# Patient Record
Sex: Female | Born: 1960 | Race: White | Hispanic: Yes | Marital: Married | Smoking: Never smoker
Health system: Southern US, Community
[De-identification: ages and names within clinical notes are randomized; demographics above are authoritative.]

## PROBLEM LIST (undated history)

## (undated) DIAGNOSIS — K2270 Barrett's esophagus without dysplasia: Secondary | ICD-10-CM

## (undated) DIAGNOSIS — F419 Anxiety disorder, unspecified: Secondary | ICD-10-CM

## (undated) HISTORY — PX: TRIGGER FINGER RELEASE: SHX641

## (undated) HISTORY — PX: ESOPHAGOGASTRODUODENOSCOPY: SHX1529

## (undated) HISTORY — DX: Anxiety disorder, unspecified: F41.9

## (undated) HISTORY — PX: OTHER SURGICAL HISTORY: SHX169

## (undated) HISTORY — PX: TUBAL LIGATION: SHX77

## (undated) HISTORY — PX: AUGMENTATION MAMMAPLASTY: SUR837

## (undated) HISTORY — PX: COLONOSCOPY: SHX174

## (undated) HISTORY — DX: Barrett's esophagus without dysplasia: K22.70

---

## 2006-08-29 ENCOUNTER — Ambulatory Visit: Payer: Self-pay | Admitting: Gastroenterology

## 2006-08-29 LAB — CONVERTED CEMR LAB
Albumin: 3.7 g/dL (ref 3.5–5.2)
Alkaline Phosphatase: 56 units/L (ref 39–117)
BUN: 7 mg/dL (ref 6–23)
Basophils Absolute: 0 10*3/uL (ref 0.0–0.1)
Eosinophils Absolute: 0.3 10*3/uL (ref 0.0–0.6)
Folate: 10.2 ng/mL
GFR calc Af Amer: 140 mL/min
Iron: 65 ug/dL (ref 42–145)
Lymphocytes Relative: 36.9 % (ref 12.0–46.0)
MCHC: 34 g/dL (ref 30.0–36.0)
MCV: 92.5 fL (ref 78.0–100.0)
Monocytes Relative: 5.8 % (ref 3.0–11.0)
Neutro Abs: 2.5 10*3/uL (ref 1.4–7.7)
Platelets: 264 10*3/uL (ref 150–400)
Potassium: 4.4 meq/L (ref 3.5–5.1)
RBC: 3.69 M/uL — ABNORMAL LOW (ref 3.87–5.11)
Saturation Ratios: 17.6 % — ABNORMAL LOW (ref 20.0–50.0)
Sodium: 139 meq/L (ref 135–145)
Total Bilirubin: 0.5 mg/dL (ref 0.3–1.2)
Total Protein: 6.8 g/dL (ref 6.0–8.3)
Transferrin: 263.7 mg/dL (ref >=212.0)

## 2006-09-07 ENCOUNTER — Ambulatory Visit: Payer: Self-pay | Admitting: Gastroenterology

## 2006-09-13 ENCOUNTER — Ambulatory Visit: Payer: Self-pay | Admitting: Gastroenterology

## 2006-09-13 ENCOUNTER — Encounter (INDEPENDENT_AMBULATORY_CARE_PROVIDER_SITE_OTHER): Payer: Self-pay | Admitting: Specialist

## 2006-09-20 ENCOUNTER — Ambulatory Visit: Payer: Self-pay | Admitting: Gastroenterology

## 2006-10-03 ENCOUNTER — Ambulatory Visit: Payer: Self-pay | Admitting: Gastroenterology

## 2006-10-05 ENCOUNTER — Ambulatory Visit (HOSPITAL_COMMUNITY): Admission: RE | Admit: 2006-10-05 | Discharge: 2006-10-05 | Payer: Self-pay | Admitting: Gastroenterology

## 2006-12-01 ENCOUNTER — Ambulatory Visit: Payer: Self-pay | Admitting: Gastroenterology

## 2006-12-07 ENCOUNTER — Other Ambulatory Visit: Admission: RE | Admit: 2006-12-07 | Discharge: 2006-12-07 | Payer: Self-pay | Admitting: Gynecology

## 2007-04-11 ENCOUNTER — Other Ambulatory Visit: Admission: RE | Admit: 2007-04-11 | Discharge: 2007-04-11 | Payer: Self-pay | Admitting: Gynecology

## 2007-07-25 HISTORY — PX: ENDOMETRIAL ABLATION: SHX621

## 2007-08-14 DIAGNOSIS — Z87442 Personal history of urinary calculi: Secondary | ICD-10-CM

## 2007-08-14 DIAGNOSIS — A048 Other specified bacterial intestinal infections: Secondary | ICD-10-CM | POA: Insufficient documentation

## 2007-08-14 DIAGNOSIS — K219 Gastro-esophageal reflux disease without esophagitis: Secondary | ICD-10-CM

## 2007-08-14 DIAGNOSIS — D539 Nutritional anemia, unspecified: Secondary | ICD-10-CM | POA: Insufficient documentation

## 2007-08-14 DIAGNOSIS — Z87898 Personal history of other specified conditions: Secondary | ICD-10-CM | POA: Insufficient documentation

## 2007-08-14 DIAGNOSIS — K828 Other specified diseases of gallbladder: Secondary | ICD-10-CM | POA: Insufficient documentation

## 2007-08-14 DIAGNOSIS — K2270 Barrett's esophagus without dysplasia: Secondary | ICD-10-CM

## 2008-03-20 ENCOUNTER — Encounter: Admission: RE | Admit: 2008-03-20 | Discharge: 2008-03-20 | Payer: Self-pay | Admitting: Gynecology

## 2008-03-31 ENCOUNTER — Encounter: Admission: RE | Admit: 2008-03-31 | Discharge: 2008-03-31 | Payer: Self-pay | Admitting: Gynecology

## 2008-04-02 ENCOUNTER — Other Ambulatory Visit: Admission: RE | Admit: 2008-04-02 | Discharge: 2008-04-02 | Payer: Self-pay | Admitting: Gynecology

## 2008-04-02 ENCOUNTER — Telehealth: Payer: Self-pay | Admitting: Gastroenterology

## 2008-04-02 ENCOUNTER — Encounter: Payer: Self-pay | Admitting: Gynecology

## 2008-04-02 ENCOUNTER — Ambulatory Visit: Payer: Self-pay | Admitting: Gynecology

## 2008-04-17 ENCOUNTER — Ambulatory Visit: Payer: Self-pay | Admitting: Gastroenterology

## 2008-05-07 ENCOUNTER — Ambulatory Visit (HOSPITAL_COMMUNITY): Admission: RE | Admit: 2008-05-07 | Discharge: 2008-05-07 | Payer: Self-pay | Admitting: Gastroenterology

## 2008-10-24 ENCOUNTER — Encounter: Payer: Self-pay | Admitting: Gynecology

## 2008-10-24 ENCOUNTER — Other Ambulatory Visit: Admission: RE | Admit: 2008-10-24 | Discharge: 2008-10-24 | Payer: Self-pay | Admitting: Gynecology

## 2008-10-24 ENCOUNTER — Ambulatory Visit: Payer: Self-pay | Admitting: Gynecology

## 2008-11-13 ENCOUNTER — Ambulatory Visit: Payer: Self-pay | Admitting: Gynecology

## 2009-02-04 ENCOUNTER — Telehealth: Payer: Self-pay | Admitting: Gastroenterology

## 2009-02-04 ENCOUNTER — Ambulatory Visit: Payer: Self-pay | Admitting: Gynecology

## 2009-03-05 ENCOUNTER — Ambulatory Visit: Payer: Self-pay | Admitting: Gastroenterology

## 2009-03-23 ENCOUNTER — Ambulatory Visit: Payer: Self-pay | Admitting: Gastroenterology

## 2009-03-30 ENCOUNTER — Encounter: Admission: RE | Admit: 2009-03-30 | Discharge: 2009-03-30 | Payer: Self-pay | Admitting: Gynecology

## 2009-03-31 ENCOUNTER — Encounter: Payer: Self-pay | Admitting: Gastroenterology

## 2009-04-06 ENCOUNTER — Ambulatory Visit: Payer: Self-pay | Admitting: Gynecology

## 2009-04-06 ENCOUNTER — Telehealth: Payer: Self-pay | Admitting: Gastroenterology

## 2009-04-06 ENCOUNTER — Other Ambulatory Visit: Admission: RE | Admit: 2009-04-06 | Discharge: 2009-04-06 | Payer: Self-pay | Admitting: Gynecology

## 2009-04-13 ENCOUNTER — Ambulatory Visit: Payer: Self-pay | Admitting: Gynecology

## 2009-06-11 ENCOUNTER — Ambulatory Visit: Payer: Self-pay | Admitting: Gynecology

## 2010-04-06 ENCOUNTER — Encounter: Admission: RE | Admit: 2010-04-06 | Discharge: 2010-04-06 | Payer: Self-pay | Admitting: Gynecology

## 2010-04-12 ENCOUNTER — Ambulatory Visit: Payer: Self-pay | Admitting: Gynecology

## 2010-04-12 ENCOUNTER — Other Ambulatory Visit
Admission: RE | Admit: 2010-04-12 | Discharge: 2010-04-12 | Payer: Self-pay | Source: Home / Self Care | Admitting: Gynecology

## 2010-05-13 ENCOUNTER — Ambulatory Visit
Admission: RE | Admit: 2010-05-13 | Discharge: 2010-05-13 | Payer: Self-pay | Source: Home / Self Care | Attending: Gynecology | Admitting: Gynecology

## 2010-05-19 ENCOUNTER — Ambulatory Visit
Admission: RE | Admit: 2010-05-19 | Discharge: 2010-05-19 | Payer: Self-pay | Source: Home / Self Care | Attending: Gynecology | Admitting: Gynecology

## 2010-09-02 IMAGING — MG MM DIAGNOSTIC LTD LEFT
3 series · 3 of 3 positions shown · non-contrast
Comparison: No prior exams.

CLINICAL DATA: Patient presents for additional views of the left
breast as a follow up to recent screening mammogram from 03/20/2008
demonstrating a 4 mm nodular density in the inner left breast.

DIGITAL DIAGNOSTIC  LEFT  MAMMOGRAM   AND LEFT BREAST ULTRASOUND:

[L CCID]
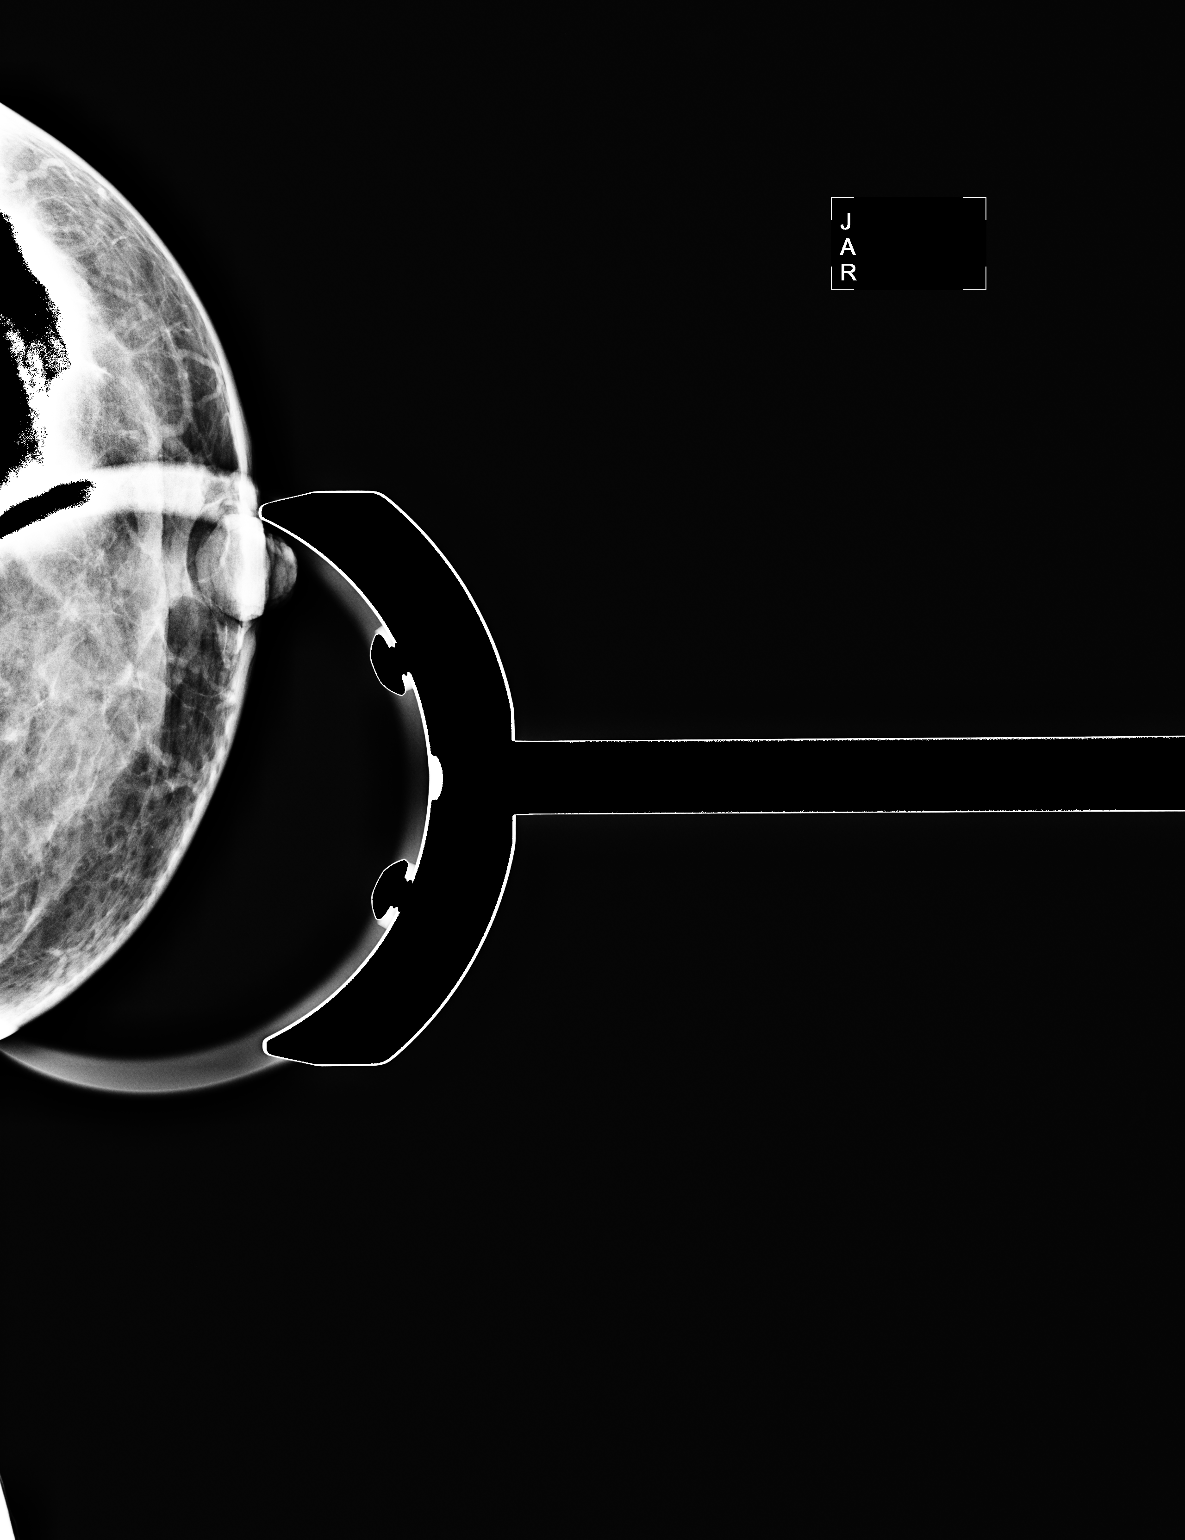

[L ML]
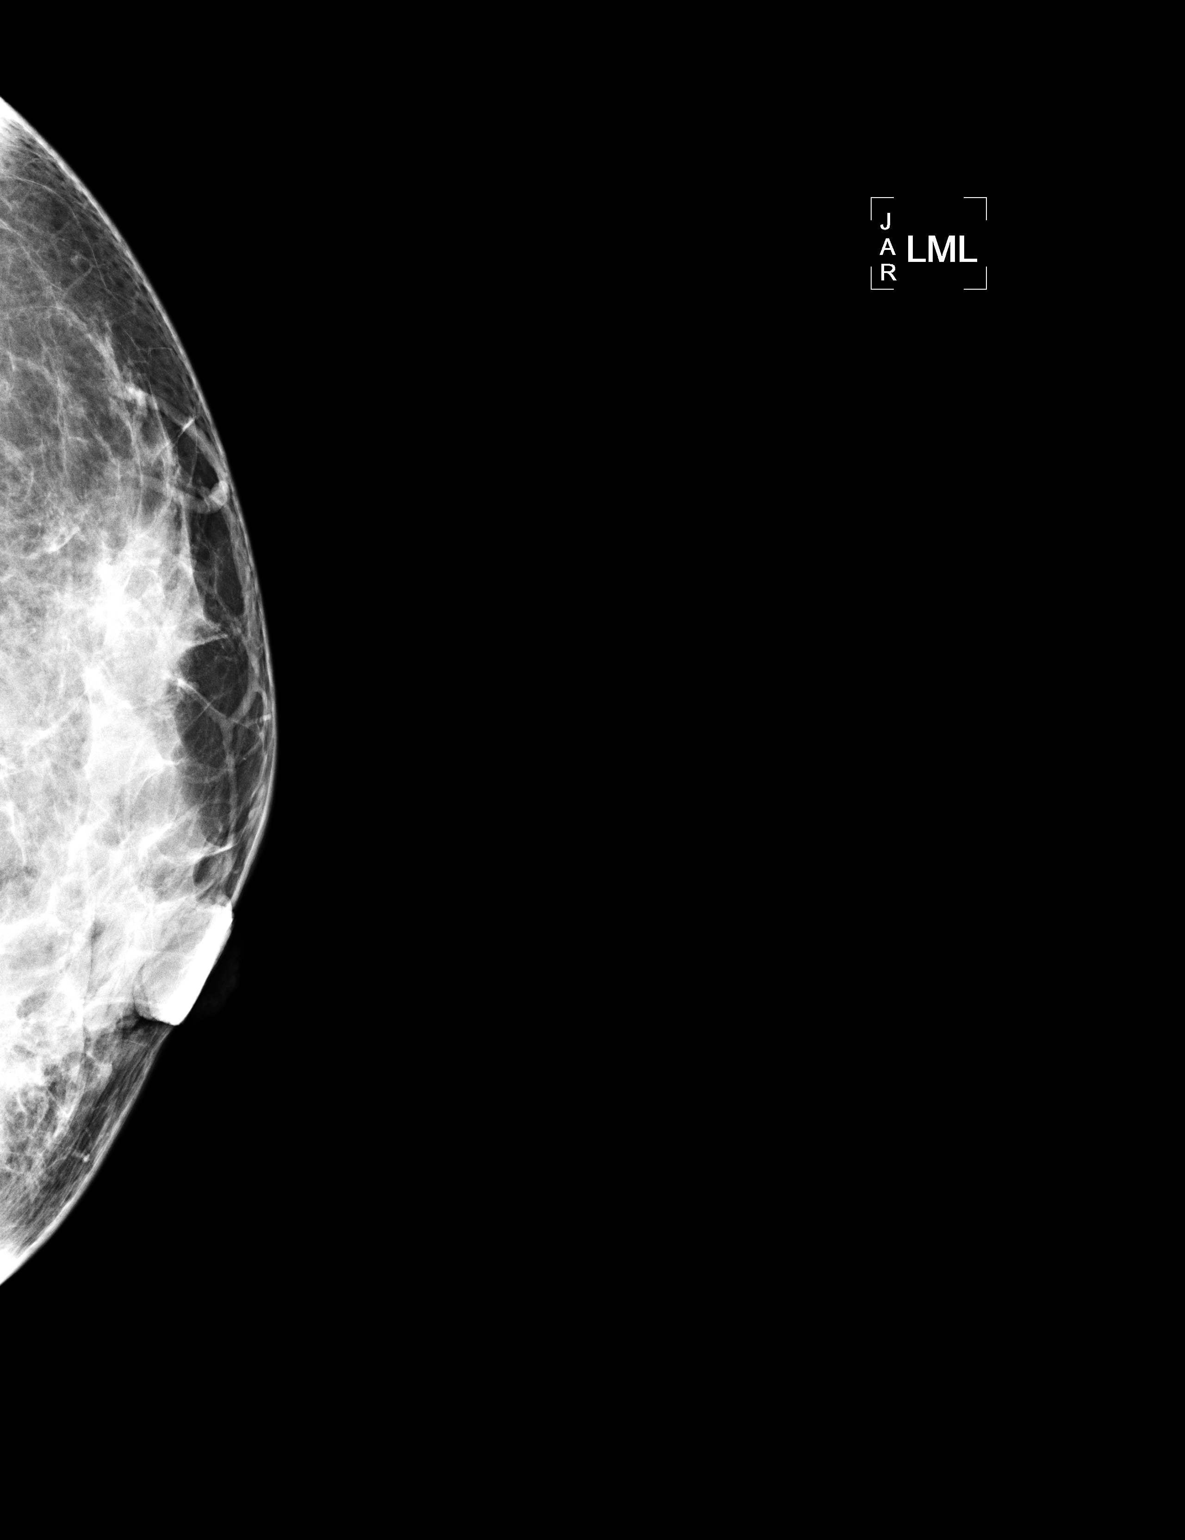

[L MLOID]
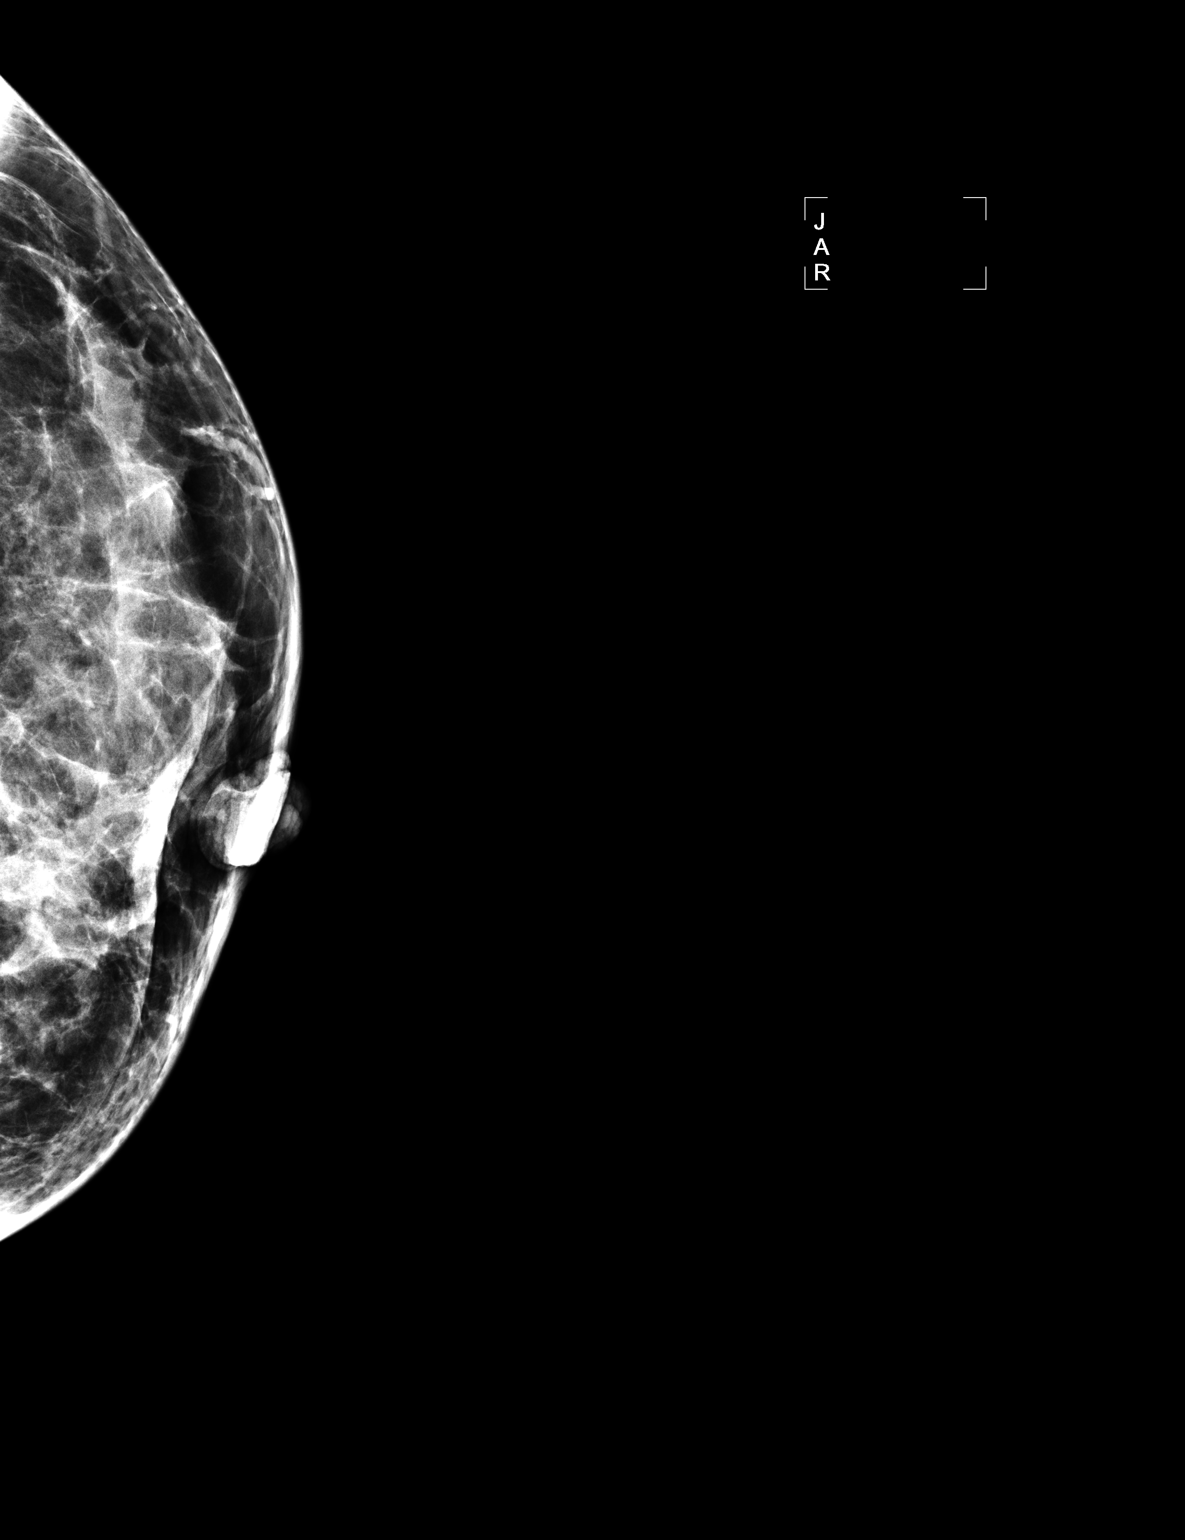

[3 of 3 positions shown; findings below may reference images not displayed]

FINDINGS: A true lateral image as well as spot compression CC image
and MLO view were obtained.  The additional images fail to
demonstrated a focal abnormality in the inner left breast.  Due to
its appearance on the original screening CC image, an ultrasound
will be performed.

Ultrasound is performed, showing no focal abnormality in the inner
left breast.
IMPRESSION: No focal abnormality in the inner left breast.

Recommendations:  Recommend continued annual screening mammographic
followup.

BI-RADS CATEGORY 1:  Negative.

## 2010-09-14 NOTE — Assessment & Plan Note (Signed)
[facility]                         GASTROENTEROLOGY OFFICE NOTE   Gail Miller, Gail Miller                         MRN:          [id number]  DATE:12/01/2006                            DOB:          01/06/1961    [name] is completely asymptomatic on an antireflux regimen on daily  Nexium.  Her endoscopy showed a short segment of Barrett's mucosa,  consistent with chronic acid reflux.  Gallbladder workup, including CCK-  HIDA scan, was normal.  She does have a 7.5 gallbladder polyp.  We will  repeat her ultrasound exam in six months.   I have explained Barrett's mucosa to [name].  I think she is at very low  risk for esophageal cancer.  I would suggest that she have endoscopy at  three-year intervals, that she continue chronic PPI therapy.   On her lab data, she had some mild iron deficiency and I have given her  a couple of months of iron replacement therapy to take.   I have scheduled her to see Dr. Reynaldo Minium for OB-GYN evaluation.  She does have mild menometrorrhagia and needs to establish with a  gynecologist since moving to [name] from [name].  I will see her on a  yearly basis or p.r.n., as needed.     Vania Rea. Jarold Motto, MD, Caleen Essex, FAGA  Electronically Signed    DRP/MedQ  DD: 12/01/2006  DT: 12/01/2006  Job #: [id number]   cc:   Dollene Cleveland, M.D.  Dr. Paulene Floor  Gaetano Hawthorne. Lily Peer, M.D.

## 2010-09-14 NOTE — Assessment & Plan Note (Signed)
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 Paulene Floor for evaluation of epigastric abdominal pain.   HISTORY OF PRESENT ILLNESS:  In March, Gail Miller developed some nausea  and epigastric pain which she felt was related to a viral syndrome that  affected her syndrome.  Her children got well but she continued with  epigastric discomfort and nausea and saw Dr. Daphine Deutscher and had a positive  Helicobacter pylori serum antibody test.  She was treated with 14 days  of Prev-Pac and really had no significant improvement.  She was found to  be iron deficient and was placed on iron and also on Nexium 40 mg day  and has had fairly good improvement but continued with periodic  subxiphoid-epigastric pain radiating to her back with mild nausea.  She  has had no true reflux symptoms or dysphagia and denies any  hepatobiliary complaints such as clay colored stools, dark urine,  icterus, fever or chills.  Her appetite was initially poor and she lost  10 pounds of weight but she has gained this back.  She has had no change  in bowel habits, melena, hematochezia or any food intolerances.  She  denies fever, chills, skin rashes, joint pains, mouth sores or any other  systemic complaints.  It is of note that the patient had colonoscopy  some 15 years ago in [name], [place], that was unremarkable.  She was  having acute diarrhea syndrome at that time.  She had not had previous  endoscopy or ultrasound examinations of her abdomen.  She saw Dr. Daphine Deutscher  and additionally apparently had some normal lab data, although I do not  have that available for review.  She denies any specific food  intolerances.  She does not use alcohol,  cigarettes or NSAIDs.  She does  have migraine headaches but uses p.r.n. Tylenol.   PAST MEDICAL HISTORY:  1. Problems with kidney stones.  2. She has had a previous tubal ligation and breast surgery.  She has      not had regular menstrual periods in over a year.   MEDICATIONS:  1. Nexium 40 mg a day.  2. Zoloft 100 mg a day.  3. Butal-APAP p.r.n. for headaches.   FAMILY HISTORY:  Remarkable for unexplained liver disease in her  maternal grandmother and cousin.  There are no known other  gastrointestinal problems.   SOCIAL HISTORY:  She is married and lives with her husband and children.  She has some college education.  She smokes about five cigarettes a day  and denies alcohol abuse.   REVIEW OF SYSTEMS:  Remarkable for frequent nonproductive cough, some  chronic low back pain and chronic fatigue.  She denies any specific  cardiovascular, pulmonary, genitourinary, neurologic or orthopedic  problems.  She does have a past history of anxiety  and uses Zoloft.   PHYSICAL EXAMINATION:  GENERAL APPEARANCE:  An attractive female  appearing younger than her stated age.  VITAL SIGNS:  She is 5 feet 6 inches tall and weighs 125 pounds.  Blood  pressure 110/72, pulse 76 and regular.  I could not appreciate stigmata of chronic liver disease or thyromegaly.  CHEST:  Entirely clear.  CARDIOVASCULAR:  She was in a regular rhythm without murmurs, rubs, or  gallops.  ABDOMEN:  Flat and nontender without organomegaly, masses, or  significant tenderness.  Bowel sounds were normal.  EXTREMITIES:  Unremarkable.  RECTAL:  Deferred.  NEUROLOGIC:  Mental status was clear.   ASSESSMENT:  1. Epigastric abdominal pain radiating to her back consistent with      acid reflux disease or perhaps nonsteroidal anti-inflammatory drug-      induced peptic ulcer disease.  2. History of positive Helicobacter pylori antibody with no response      to Prev-Pac therapy.  3. Rule out cholelithiasis.  4.  History of chronic anemia of unexplained etiology.  5. Vague history of familial liver disease.   RECOMMENDATIONS:  1. Check CBC, liver profile, B12, folate and iron levels.  2. Outpatient ultrasound and endoscopic exam and will check for      Helicobacter pylori with biopsies.  3. Continue Nexium 40 mg 30 minutes before the first meal of the day      and twice a day if needed.  4. Consider colonoscopy depending on the clinical course and work-up.     Vania Rea. Jarold Motto, MD, Caleen Essex, FAGA  Electronically Signed    DRP/MedQ  DD: 08/29/2006  DT: 08/29/2006  Job #: [id number]

## 2010-09-14 NOTE — Assessment & Plan Note (Signed)
[facility]                         GASTROENTEROLOGY OFFICE NOTE   SHELLI, PORTILLA                         MRN:          [id number]  DATE:10/03/2006                            DOB:          1961/02/13    Gail Miller returns today and still is having some subxiphoid discomfort and  nausea.  She has not taken her proton pump inhibitor therapy as  recommended, and is only using this on a p.r.n. basis.  She continues to  complain of a fullness in her subxiphoid area with some belching and  burping.  Her endoscopic exam was unremarkable, and esophageal biopsies  were apparently unremarkable also.  She had ultrasound that was repeated  because of an abnormal gallbladder, and it appears that she has a  gallbladder polyp.  Her liver function tests and other blood work is all  entirely normal.   PHYSICAL EXAMINATION:  VITAL SIGNS:  She weighs 127 pounds, and blood  pressure is 108/54.  Pulse was 78 and regular.  ABDOMEN:  Unremarkable without hepatosplenomegaly, masses or significant  tenderness.  Bowel sounds were normal.   ASSESSMENT:  1. Probable GERD with incomplete treatment.  2. Rule out dysfunctional gallbladder.  3. 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   RECOMMENDATIONS:  1. Check CCK-HIDA scan.  2. Reflux regime daily.  3. Nexium 40 mg 30 minutes before breakfast.  4. Referral to Dr. Dorinda Hill in dermatology for consultation.  5. GI follow up in one months' time.     Gail Miller. Jarold Motto, MD, Caleen Essex, FAGA  Electronically Signed    DRP/MedQ  DD: 10/03/2006  DT: 10/03/2006  Job #: [id number]   cc:   Paulene Floor, M.D.  Dollene Cleveland, M.D.

## 2011-02-24 ENCOUNTER — Other Ambulatory Visit: Payer: Self-pay | Admitting: *Deleted

## 2011-02-24 MED ORDER — ALPRAZOLAM 0.25 MG PO TABS: 0.2500 mg | ORAL_TABLET | Freq: Every evening | ORAL | Status: DC | PRN

## 2011-02-24 NOTE — Telephone Encounter (Signed)
rx called

## 2011-03-16 ENCOUNTER — Encounter: Payer: Self-pay | Admitting: Anesthesiology

## 2011-03-22 ENCOUNTER — Other Ambulatory Visit (HOSPITAL_COMMUNITY)
Admission: RE | Admit: 2011-03-22 | Discharge: 2011-03-22 | Disposition: A | Discharge status: Home / Self Care | Payer: BC Managed Care – PPO | Source: Ambulatory Visit | Attending: Gynecology | Admitting: Gynecology

## 2011-03-22 ENCOUNTER — Encounter: Payer: Self-pay | Admitting: Gynecology

## 2011-03-22 ENCOUNTER — Ambulatory Visit (INDEPENDENT_AMBULATORY_CARE_PROVIDER_SITE_OTHER): Payer: BC Managed Care – PPO | Admitting: Gynecology

## 2011-03-22 VITALS — BP 108/70 | Ht 65.5 in | Wt 123.0 lb

## 2011-03-22 DIAGNOSIS — Z01419 Encounter for gynecological examination (general) (routine) without abnormal findings: Secondary | ICD-10-CM | POA: Insufficient documentation

## 2011-03-22 DIAGNOSIS — F419 Anxiety disorder, unspecified: Secondary | ICD-10-CM

## 2011-03-22 DIAGNOSIS — N951 Menopausal and female climacteric states: Secondary | ICD-10-CM

## 2011-03-22 DIAGNOSIS — R82998 Other abnormal findings in urine: Secondary | ICD-10-CM

## 2011-03-22 DIAGNOSIS — Z780 Asymptomatic menopausal state: Secondary | ICD-10-CM | POA: Insufficient documentation

## 2011-03-22 DIAGNOSIS — Z13220 Encounter for screening for lipoid disorders: Secondary | ICD-10-CM

## 2011-03-22 DIAGNOSIS — F411 Generalized anxiety disorder: Secondary | ICD-10-CM

## 2011-03-22 DIAGNOSIS — E559 Vitamin D deficiency, unspecified: Secondary | ICD-10-CM | POA: Insufficient documentation

## 2011-03-22 MED ORDER — ALPRAZOLAM 0.25 MG PO TABS: 0.2500 mg | ORAL_TABLET | Freq: Every evening | ORAL | Status: AC | PRN

## 2011-03-22 NOTE — Progress Notes (Signed)
Gail Miller 1960-10-20 [id number]   History:    50 y.o.  for annual exam who states that she has noticed no difference in her minimal vasomotor symptoms now as she did when she was not on hormone replacement therapy. She has history vitamin D deficiency in the past and is currently on vitamin D 50,000 units q. monthly. Mammogram December 2011 normal patient with prior silicon implants does her monthly self breast examination.  Past medical history,surgical history, family history and social history were all reviewed and documented in the EPIC chart.  Gynecologic History Patient's last menstrual period was 03/04/2011. Contraception: tubal ligation Last Pap: 2011. Results were:normal} Last mammogram: 2011. Results were:normal}  Obstetric History OB History    Grav Para Term Preterm Abortions TAB SAB Ect Mult Living   4 3 3  1  1   3      # Outc Date GA Lbr Len/2nd Wgt Sex Del Anes PTL Lv   1 TRM     M SVD  No Yes   2 TRM     M SVD  No Yes   3 TRM     M SVD  No Yes   4 SAB                ROS:  Was performed and pertinent positives and negatives are included in the history.  Exam: chaperone present  BP 108/70  Ht 5' 5.5" (1.664 m)  Wt 123 lb (55.792 kg)  BMI 20.16 kg/m2  LMP 03/04/2011  Body mass index is 20.16 kg/(m^2).  General appearance : Well developed well nourished female. No acute distress HEENT: Neck supple, trachea midline, no carotid bruits, no thyroidmegaly Lungs: Clear to auscultation, no rhonchi or wheezes, or rib retractions  Heart: Regular rate and rhythm, no murmurs or gallops Breast:Examined in sitting and supine position were symmetrical in appearance, no palpable masses or tenderness,  no skin retraction, no nipple inversion, no nipple discharge, no skin discoloration, no axillary or supraclavicular lymphadenopathy Abdomen: no palpable masses or tenderness, no rebound or guarding Extremities: no edema or skin discoloration or  tenderness  Pelvic:  Bartholin, Urethra, Skene Glands: Within normal limits             Vagina: No gross lesions or discharge  Cervix: No gross lesions or discharge  Uterus  anteverted, normal size, shape and consistency, non-tender and mobile  Adnexa  Without masses or tenderness  Anus and perineum  normal   Rectovaginal  normal sphincter tone without palpated masses or tenderness             Hemoccult not done     Assessment/Plan:  50 y.o. female for annual exam who states that she has minimal vasomotor symptoms and has noticed no change in comparison to when she was off of hormone replacement therapy. She will discontinue the elestrin and the Provera and we will monitor her symptoms over the course of the next 3-6 months. She was encouraged to continue monthly self breast examination. Requisition to schedule mammogram was provided. She had a bone density study in January of 2012 with her lowest T score at the right femoral neck with a value of -2.0. Her Frax analysis demonstrated her major osteoporotic fracture risk over a 10 year. Is 4.2% and her 10 year probability of hip fracture and 0.7% both sub-threshold for additional treatment at the present time. We'll repeat her bone density study in 2 years. She was instructed to continue with her calcium and vitamin  D and to engage in weightbearing exercises at least 45 minutes 3 or 4 times a week. Patient was scheduled her mammogram for next month. Next year she will need a colonoscopy. She'll stop by the lab we'll check her CBC cholesterol urinalysis along with vitamin D. Pap smear done today. Prescription refill for Xanax 0.25 mg she takes in appearance base is was provided as well.   Ok Edwards MD, 9:37 AM 03/22/2011

## 2011-03-23 ENCOUNTER — Telehealth: Payer: Self-pay | Admitting: *Deleted

## 2011-03-23 LAB — VITAMIN D 25 HYDROXY (VIT D DEFICIENCY, FRACTURES): Vit D, 25-Hydroxy: 27 ng/mL — ABNORMAL LOW (ref 30–89)

## 2011-03-23 MED ORDER — SULFAMETHOXAZOLE-TMP DS 800-160 MG PO TABS: 1.0000 | ORAL_TABLET | Freq: Two times a day (BID) | ORAL | Status: AC

## 2011-03-23 NOTE — Telephone Encounter (Signed)
PT HAS BEEN INFORMED OF BACTERIA IN URINE FOR UTI AND WILL TAKE MEDICATION AS DIRECTED.

## 2011-03-28 ENCOUNTER — Other Ambulatory Visit: Payer: Self-pay | Admitting: *Deleted

## 2011-03-28 DIAGNOSIS — N390 Urinary tract infection, site not specified: Secondary | ICD-10-CM

## 2011-03-29 ENCOUNTER — Telehealth: Payer: Self-pay | Admitting: *Deleted

## 2011-03-29 MED ORDER — FLUCONAZOLE 150 MG PO TABS: 150.0000 mg | ORAL_TABLET | Freq: Once | ORAL | Status: AC

## 2011-03-29 NOTE — Telephone Encounter (Signed)
Pt informed that diflucan sent to pharmacy.

## 2011-03-29 NOTE — Telephone Encounter (Signed)
Pt called complaining of yeast infection from taking bactrim ds on 03/23/11 given by TF for UTI. Pt only has itching, no white discharge or other complains. She would like something to relieve itching. Please advise.

## 2011-04-04 ENCOUNTER — Ambulatory Visit (INDEPENDENT_AMBULATORY_CARE_PROVIDER_SITE_OTHER): Payer: BC Managed Care – PPO | Admitting: Gynecology

## 2011-04-04 DIAGNOSIS — R8271 Bacteriuria: Secondary | ICD-10-CM

## 2011-04-04 DIAGNOSIS — N390 Urinary tract infection, site not specified: Secondary | ICD-10-CM

## 2011-04-07 ENCOUNTER — Ambulatory Visit (INDEPENDENT_AMBULATORY_CARE_PROVIDER_SITE_OTHER): Payer: BC Managed Care – PPO | Admitting: Gynecology

## 2011-04-07 DIAGNOSIS — R8271 Bacteriuria: Secondary | ICD-10-CM

## 2011-04-07 DIAGNOSIS — N390 Urinary tract infection, site not specified: Secondary | ICD-10-CM

## 2011-04-07 DIAGNOSIS — R82998 Other abnormal findings in urine: Secondary | ICD-10-CM

## 2011-04-08 MED ORDER — NITROFURANTOIN MONOHYD MACRO 100 MG PO CAPS: 100.0000 mg | ORAL_CAPSULE | Freq: Two times a day (BID) | ORAL | Status: AC

## 2011-04-11 ENCOUNTER — Telehealth: Payer: Self-pay | Admitting: *Deleted

## 2011-04-11 ENCOUNTER — Other Ambulatory Visit: Payer: Self-pay | Admitting: Gynecology

## 2011-04-11 DIAGNOSIS — Z1231 Encounter for screening mammogram for malignant neoplasm of breast: Secondary | ICD-10-CM

## 2011-04-11 NOTE — Telephone Encounter (Signed)
Pharmacy faxed over form  request xanax 0.5, pt needs new rx. JF wrote new rx on 03/22/11 for xanax 0.25 mg #30 2 refills. Called rx in to pharmacy

## 2011-04-18 ENCOUNTER — Telehealth: Payer: Self-pay | Admitting: *Deleted

## 2011-04-18 ENCOUNTER — Ambulatory Visit (INDEPENDENT_AMBULATORY_CARE_PROVIDER_SITE_OTHER): Payer: BC Managed Care – PPO | Admitting: Gynecology

## 2011-04-18 DIAGNOSIS — N390 Urinary tract infection, site not specified: Secondary | ICD-10-CM

## 2011-04-18 DIAGNOSIS — R82998 Other abnormal findings in urine: Secondary | ICD-10-CM

## 2011-04-18 NOTE — Telephone Encounter (Signed)
Patient called the office today but wanted to drop off her urine today. Early in the month she was treated for urinary tract infection with Macrobid. We'll have her drop a sample off for test of cure.

## 2011-04-18 NOTE — Telephone Encounter (Signed)
Pt informed with the below note and will drop off u/a

## 2011-04-18 NOTE — Telephone Encounter (Signed)
Pt has had recurrent UTI's various times, she was last treated on 04/07/11 and given macrobid 100 mg x 7days to take. Pt wants to know if she could leave a U/A to check? Pt is leaving out of town for the holidays and her insurance will change the beginning of the year. Please advise

## 2011-05-18 ENCOUNTER — Ambulatory Visit
Admission: RE | Admit: 2011-05-18 | Discharge: 2011-05-18 | Disposition: A | Discharge status: Home / Self Care | Payer: BC Managed Care – PPO | Source: Ambulatory Visit | Attending: Gynecology | Admitting: Gynecology

## 2011-05-18 DIAGNOSIS — Z1231 Encounter for screening mammogram for malignant neoplasm of breast: Secondary | ICD-10-CM

## 2011-07-18 ENCOUNTER — Other Ambulatory Visit: Payer: Self-pay | Admitting: Women's Health

## 2012-02-28 ENCOUNTER — Encounter: Payer: Self-pay | Admitting: Gastroenterology

## 2012-03-15 ENCOUNTER — Encounter: Payer: Self-pay | Admitting: Gastroenterology

## 2012-03-22 ENCOUNTER — Encounter: Payer: Self-pay | Admitting: Gynecology

## 2012-03-22 ENCOUNTER — Ambulatory Visit (INDEPENDENT_AMBULATORY_CARE_PROVIDER_SITE_OTHER): Payer: BC Managed Care – PPO | Admitting: Gynecology

## 2012-03-22 VITALS — BP 120/78 | Ht 65.75 in | Wt 123.0 lb

## 2012-03-22 DIAGNOSIS — Z01419 Encounter for gynecological examination (general) (routine) without abnormal findings: Secondary | ICD-10-CM

## 2012-03-22 DIAGNOSIS — E559 Vitamin D deficiency, unspecified: Secondary | ICD-10-CM

## 2012-03-22 DIAGNOSIS — M899 Disorder of bone, unspecified: Secondary | ICD-10-CM

## 2012-03-22 DIAGNOSIS — M8580 Other specified disorders of bone density and structure, unspecified site: Secondary | ICD-10-CM

## 2012-03-22 DIAGNOSIS — Z23 Encounter for immunization: Secondary | ICD-10-CM

## 2012-03-22 DIAGNOSIS — N942 Vaginismus: Secondary | ICD-10-CM

## 2012-03-22 LAB — CBC WITH DIFFERENTIAL/PLATELET
Basophils Absolute: 0 10*3/uL (ref 0.0–0.1)
Basophils Relative: 0 % (ref 0–1)
Eosinophils Absolute: 0.3 10*3/uL (ref 0.0–0.7)
Eosinophils Relative: 6 % — ABNORMAL HIGH (ref 0–5)
HCT: 40.1 % (ref 36.0–46.0)
MCH: 32.6 pg (ref 26.0–34.0)
MCHC: 34.2 g/dL (ref 30.0–36.0)
MCV: 95.5 fL (ref 78.0–100.0)
Monocytes Absolute: 0.3 10*3/uL (ref 0.1–1.0)
RDW: 13.3 % (ref 11.5–15.5)

## 2012-03-22 LAB — CHOLESTEROL, TOTAL: Cholesterol: 169 mg/dL (ref 0–200)

## 2012-03-22 NOTE — Progress Notes (Signed)
Gail Miller 07/19/1960 [id number]   History:    51 y.o.  for annual exam with no complaints today. Patient is perimenopausal last year she was found to have an elevated FSH. She states she had an a period in January and a light 1 early October. She denies any vasomotor symptoms. She has a history of of Barrett's esophagitis as well as vitamin D deficiency. She takes vitamin D 50,000 units q. monthly. She has a scheduled colonoscopy and upper endoscopy with Dr. Sheryn Bison in the next few weeks. Her last mammogram January 2012 which was normal she'll schedule mammogram for next month. She's had a previous tubal ligation as well as endometrial ablation. Her last bone density study was in 2012 with her lowest T score at the right femoral neck -2.0. She is taking calcium as well and is active with exercise. She does not recall ever having received the Tdap vaccine  Past medical history,surgical history, family history and social history were all reviewed and documented in the EPIC chart.  Gynecologic History Patient's last menstrual period was 02/19/2012. Contraception: tubal ligation Last Pap: 2012. Results were: normal Last mammogram: 2012. Results were: normal  Obstetric History OB History    Grav Para Term Preterm Abortions TAB SAB Ect Mult Living   4 3 3  1  1   3      # Outc Date GA Lbr Len/2nd Wgt Sex Del Anes PTL Lv   1 TRM     M SVD  No Yes   2 TRM     M SVD  No Yes   3 TRM     M SVD  No Yes   4 SAB                ROS: A ROS was performed and pertinent positives and negatives are included in the history.  GENERAL: No fevers or chills. HEENT: No change in vision, no earache, sore throat or sinus congestion. NECK: No pain or stiffness. CARDIOVASCULAR: No chest pain or pressure. No palpitations. PULMONARY: No shortness of breath, cough or wheeze. GASTROINTESTINAL: No abdominal pain, nausea, vomiting or diarrhea, melena or bright red blood per rectum. GENITOURINARY: No urinary  frequency, urgency, hesitancy or dysuria. MUSCULOSKELETAL: No joint or muscle pain, no back pain, no recent trauma. DERMATOLOGIC: No rash, no itching, no lesions. ENDOCRINE: No polyuria, polydipsia, no heat or cold intolerance. No recent change in weight. HEMATOLOGICAL: No anemia or easy bruising or bleeding. NEUROLOGIC: No headache, seizures, numbness, tingling or weakness. PSYCHIATRIC: No depression, no loss of interest in normal activity or change in sleep pattern.     Exam: chaperone present  BP 120/78  Ht 5' 5.75" (1.67 m)  Wt 123 lb (55.792 kg)  BMI 20.00 kg/m2  LMP 02/19/2012  Body mass index is 20.00 kg/(m^2).  General appearance : Well developed well nourished female. No acute distress HEENT: Neck supple, trachea midline, no carotid bruits, no thyroidmegaly Lungs: Clear to auscultation, no rhonchi or wheezes, or rib retractions  Heart: Regular rate and rhythm, no murmurs or gallops Breast:Examined in sitting and supine position were symmetrical in appearance, no palpable masses or tenderness,  no skin retraction, no nipple inversion, no nipple discharge, no skin discoloration, no axillary or supraclavicular lymphadenopathy Abdomen: no palpable masses or tenderness, no rebound or guarding Extremities: no edema or skin discoloration or tenderness  Pelvic:  Bartholin, Urethra, Skene Glands: Within normal limits             Vagina: No  gross lesions or discharge  Cervix: No gross lesions or discharge  Uterus  limited exam due to patient's vaginismus,   Adnexa limited due to patient's vaginismus  Anus and perineum  normal   Rectovaginal  normal sphincter tone without palpated masses or tenderness             Hemoccult cards provided     Assessment/Plan:  51 y.o. female for annual exam will receive the Tdap vaccine today. We discussed the new Pap smear guidelines and she would not need a Pap smear today. Patient would know prior history of abnormal Pap smears. We'll check her  vitamin D level today along with CBC, cholesterol, TSH, and urinalysis. She was encouraged to continue to do her monthly self breast examination. Requisition for mammogram to be done in January was provided. She'll return back to the office in one to 2 weeks for an ultrasound for better assessment of the uterus and adnexa  due to her vaginismus. She will followup with Dr. Jarold Motto for her colonoscopy. We'll see her back soon for ultrasound.    Ok Edwards MD, 10:23 AM 03/22/2012

## 2012-03-22 NOTE — Patient Instructions (Addendum)
Diphtheria, Tetanus, and Pertussis (DTaP) Vaccine What You Need to Know WHY GET VACCINATED? Diphtheria, tetanus, and pertussis are serious diseases caused by bacteria. Diphtheria and pertussis are spread from person to person. Tetanus enters the body through cuts or wounds. Diphtheria causes a thick covering in the back of the throat.  It can lead to breathing problems, paralysis, heart failure, and even death. Tetanus (Lockjaw) causes painful tightening of the muscles, usually all over the body.  It can lead to "locking" of the jaw so the victim cannot open his or her mouth or swallow. Tetanus leads to death in about 2 out of 10 cases. Pertussis (Whooping Cough) causes coughing spells so bad that it is hard for infants to eat, drink, or breathe. These spells can last for weeks.  It can lead to pneumonia, seizures (jerking and staring spells), brain damage, and death. Diphtheria, tetanus, and pertussis vaccine (DTaP) can help prevent these diseases. Most children who are vaccinated with DTaP will be protected throughout childhood. Many more children would get these diseases if we stopped vaccinating. DTaP is a safer version of an older vaccine called DTP. DTP is no longer used in the [place]. WHO SHOULD GET DTAP VACCINE AND WHEN? Children should get 5 doses of DTaP vaccine, 1 dose at each of the following ages:  2 months.  4 months.  6 months.  15 to 18 months.  4 to 6 years. DTaP may be given at the same time as other vaccines. SOME CHILDREN SHOULD NOT GET DTAP VACCINE OR SHOULD WAIT  Children with minor illnesses, such as a cold, may be vaccinated. But children who are moderately or severely ill should usually wait until they recover before getting DTaP vaccine.  Any child who had a life-threatening allergic reaction after a dose of DTaP should not get another dose.  Any child who suffered a brain or nervous system disease within 7 days after a dose of DTaP should not get  another dose.  Talk with your caregiver if your child:  Had a seizure or collapsed after a dose of DTaP.  Cried non-stop for 3 hours or more after a dose of DTaP.  Had a fever over 105 F (40.6 C) after a dose of DTaP.  Ask your caregiver for more information. Some of these children should not get another dose of pertussis vaccine, but may get a vaccine without pertussis, called DT. OLDER CHILDREN AND ADULTS  DTaP is not licensed for adolescents, adults, or children 7 years of age and older.  But older people still need protection. A vaccine called Tdap is similar to DTaP. A single dose of Tdap is recommended for people 11 through 50 years of age. Another vaccine, called Td, protects against tetanus and diphtheria, but not pertussis. It is recommended every 10 years. WHAT ARE THE RISKS FROM DTAP VACCINE?  Getting diphtheria, tetanus, or pertussis disease is much riskier than getting DTaP vaccine.  However, a vaccine, like any medicine, is capable of causing serious problems, such as severe allergic reactions. The risk of DTaP vaccine causing serious harm, or death, is extremely small. Mild Problems (Common)  Fever (up to about 1 child in 4).  Redness or swelling where the shot was given (up to about 1 child in 4).  Soreness or tenderness where the shot was given (up to about 1 child in 4). These problems occur more often after the 4th and 5th doses of the DTaP series than after earlier doses. Sometimes the 4th   or 5th dose of DTaP vaccine is followed by swelling of the entire arm or leg in which the shot was given, lasting 1 to 7 days (up to about 1 child in 30). Other mild problems include:  Fussiness (up to about 1 child in 3).  Tiredness or poor appetite (up to about 1 child in 10).  Vomiting (up to about 1 child in 50). These problems generally occur 1 to 3 days after the shot. Moderate Problems (Uncommon)  Seizure (jerking or staring) (about 1 child out of  14,000).  Non-stop crying, for 3 hours or more (up to about 1 child out of 1,000).  High fever, over 105 F (40.6 C) (about 1 child out of 16,000). Severe Problems (Very Rare)  Serious allergic reaction (less than 1 out of a million doses).  Several other severe problems have been reported after DTaP vaccine. These include:  Long-term seizures, coma, or lowered consciousness.  Permanent brain damage. These are so rare it is hard to tell if they are caused by the vaccine. Controlling fever is especially important for children who have had seizures, for any reason. It is also important if another family member has had seizures. You can reduce fever and pain by giving your child an aspirin-free pain reliever when the shot is given, and for the next 24 hours, following the package instructions. WHAT IF THERE IS A MODERATE OR SEVERE REACTION? What should I look for? Any unusual conditions, such as a serious allergic reaction, high fever, or unusual behavior. Serious allergic reactions are extremely rare with any vaccine. If one were to occur, it would most likely be within a few minutes to a few hours after the shot. Signs can include difficulty breathing, hoarseness or wheezing, hives, paleness, weakness, a fast heartbeat, or dizziness. If a high fever or seizure were to occur, it would usually be within a week after the shot. What should I do?  Call your caregiver or get the person to a caregiver right away.  Tell the caregiver what happened, the date and time it happened, and when the vaccination was given.  Ask the caregiver, nurse, or health department to file a Vaccine Adverse Event Reporting System (VAERS) form. Or, you can file this report through the VAERS website at www.vaers.hhs.gov or by calling 1-800-822-7967. VAERS does not provide medical advice. THE NATIONAL VACCINE INJURY COMPENSATION PROGRAM  In the rare event that you or your child has a serious reaction to a vaccine, a  federal program has been created to help you pay for the care of those who have been harmed.  For details about the National Vaccine Injury Compensation Program, call 1-800-338-2382 or visit the program's website at www.hrsa.gov/vaccinecompensation HOW CAN I LEARN MORE?  Ask your caregiver. They can give you the vaccine package insert or suggest other sources of information.  Call your local or state health department's immunization program.  Contact the Centers for Disease Control and Prevention (CDC):  Call 1-800-232-4636 (1-800-CDC-INFO).  Visit the National Immunization Program's website at www.cdc.gov/vaccines CDC Diphtheria, Tetanus, and Pertussis (DTaP) Vaccine VIS (09/15/05) Document Released: 02/13/2006 Document Revised: 07/11/2011 Document Reviewed: 02/13/2006 ExitCare Patient Information 2013 [employer].  

## 2012-03-22 NOTE — Addendum Note (Signed)
Addended by: Bertram Savin A on: 03/22/2012 10:44 AM   Modules accepted: Orders

## 2012-03-23 ENCOUNTER — Other Ambulatory Visit: Payer: Self-pay | Admitting: Gynecology

## 2012-03-23 ENCOUNTER — Encounter: Payer: Self-pay | Admitting: Gynecology

## 2012-03-23 ENCOUNTER — Telehealth: Payer: Self-pay | Admitting: Gynecology

## 2012-03-23 DIAGNOSIS — Z1231 Encounter for screening mammogram for malignant neoplasm of breast: Secondary | ICD-10-CM

## 2012-03-23 DIAGNOSIS — E559 Vitamin D deficiency, unspecified: Secondary | ICD-10-CM

## 2012-03-23 LAB — URINALYSIS W MICROSCOPIC + REFLEX CULTURE
Bilirubin Urine: NEGATIVE
Glucose, UA: NEGATIVE mg/dL
Ketones, ur: NEGATIVE mg/dL
Specific Gravity, Urine: 1.012 (ref 1.005–1.030)
Urobilinogen, UA: 1 mg/dL (ref 0.0–1.0)
pH: 6 (ref 5.0–8.0)

## 2012-03-23 LAB — VITAMIN D 25 HYDROXY (VIT D DEFICIENCY, FRACTURES): Vit D, 25-Hydroxy: 23 ng/mL — ABNORMAL LOW (ref 30–89)

## 2012-03-23 MED ORDER — VITAMIN D (ERGOCALCIFEROL) 1.25 MG (50000 UNIT) PO CAPS: 50000.0000 [IU] | ORAL_CAPSULE | ORAL | Status: DC

## 2012-03-23 NOTE — Telephone Encounter (Signed)
error 

## 2012-03-26 ENCOUNTER — Other Ambulatory Visit: Payer: Self-pay | Admitting: Gynecology

## 2012-03-26 LAB — URINE CULTURE

## 2012-03-26 MED ORDER — NITROFURANTOIN MONOHYD MACRO 100 MG PO CAPS: 100.0000 mg | ORAL_CAPSULE | Freq: Two times a day (BID) | ORAL | Status: AC

## 2012-04-02 ENCOUNTER — Encounter: Payer: Self-pay | Admitting: Gynecology

## 2012-04-04 ENCOUNTER — Ambulatory Visit (INDEPENDENT_AMBULATORY_CARE_PROVIDER_SITE_OTHER): Payer: BC Managed Care – PPO

## 2012-04-04 ENCOUNTER — Ambulatory Visit (INDEPENDENT_AMBULATORY_CARE_PROVIDER_SITE_OTHER): Payer: BC Managed Care – PPO | Admitting: Gynecology

## 2012-04-04 ENCOUNTER — Encounter: Payer: Self-pay | Admitting: Gynecology

## 2012-04-04 DIAGNOSIS — N852 Hypertrophy of uterus: Secondary | ICD-10-CM

## 2012-04-04 DIAGNOSIS — K5900 Constipation, unspecified: Secondary | ICD-10-CM

## 2012-04-04 DIAGNOSIS — D259 Leiomyoma of uterus, unspecified: Secondary | ICD-10-CM

## 2012-04-04 DIAGNOSIS — N942 Vaginismus: Secondary | ICD-10-CM | POA: Insufficient documentation

## 2012-04-04 DIAGNOSIS — N83339 Acquired atrophy of ovary and fallopian tube, unspecified side: Secondary | ICD-10-CM

## 2012-04-04 NOTE — Progress Notes (Signed)
Patient presented to the office today for an ultrasound due to the fact that time of her annual exam on November 21 pelvic exam was limited due to patient's vaginismus.  Ultrasound today: Uterus measured 9.9 x 6.8 x 4.3 cm endometrial stripe 1.8 mm. 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 Right and left ovary were both atrophic. Excess of bowel activity was noted.  Patient states that she suffers at time from constipation. She will be placed on MiraLax 1 tablespoon daily. She is scheduled for a colonoscopy next month with Dr. Sheryn Bison. She will be due for bone density study next year here in our office. She was recently found to have vitamin D deficiency and is currently on vitamin D 50,000 units q. weekly for 12 weeks. She will then return to the office after the 12 weeks for a vitamin D level blood tests. Once the level returns back to normal she will be placed on vitamin D 3 cholecalciferol 2000 units daily along with 1200 mg of calcium. She was recently treated for urinary tract infection with Macrobid and is doing well. She's otherwise scheduled for the GYN exam next year. Her CBC, TSH, and blood sugar were normal.

## 2012-04-04 NOTE — Patient Instructions (Addendum)
Sndrome de colon irritable (Irritable Bowel Syndrome) El sndrome de colon irritable se origina en un trastorno de la funcin normal del intestino Tambin se lo denomina colon espstico, colitis mucosa y colon irritable. No es necesario realizar un tratamiento quirrgico, ni es un problema que pueda transformarse en cncer. No hay cura para este sndrome. Pero con Ardelia Mems dieta apropiada, reduccin del estrs y Conservation officer, nature, usted notar que sus problemas (sntomas) gradualmente desaparecern o mejorarn. Se trata de una enfermedad frecuente del aparato digestivo. Aparece con frecuencia a finales de la adolescencia o comienzos de la vida adulta., La proporcin de mujeres que sufren este trastorno es del doble que los hombres. CAUSAS Luego que el alimento es digerido y absorbido en el intestino delgado, Agricultural engineer de desecho se dirige hacia el colon (intestino grueso). En el colon se absorben el agua y las sales que provienen de los alimentos no digeridos que se encuentran en el intestino delgado. Los residuos que [place], o materia fecal, se retiene para su posterior eliminacin. Bajo circunstancias normales, ciertas contracciones suaves y rtmicas de las paredes del intestino empujan la materia fecal a lo largo del colon, hacia el recto. Sin embargo, cuando se presenta este problema, estas contracciones son irregulares y no coordinadas. Entonces ocurre que la materia fecal se retiene por un perodo prolongado, lo que origina constipacin, o se expele demasiado rpido, lo que produce diarrea. SNTOMAS El sntoma ms frecuente es Conservation officer, historic buildings. Lo ms frecuente es que el dolor se localice en la zona izquierda inferior del vientre (abdomen). Pero puede ocurrir en cualquier rea del abdomen. Puede sentirse como acidez de [place], Social research officer, government de espalda o Nurse, children's un dolor sordo en los brazos o en los hombros. El dolor se origina en espasmos excesivos de los msculos del intestino y de la acumulacin de gases y materia fecal en  el colon. Este dolor:  Puede oscilar entre clicos agudos en el vientre (abdomen) hasta un dolor sordo y continuo.  Generalmente empeora luego de comer.  Es caracterstico que se alivie al mover el intestino o [employer] gases. Generalmente se acompaa de constipacin. Pero tambin puede [employer]. La diarrea se produce inmediatamente despus de comer o al levantarse por la maana. Las heces son blandas y Research officer, trade union. Con frecuencia estn mezcladas con secreciones (mucus). Otros sntomas son:  Cabin crew  [employer]  Prdida del apetito  Ganas de vomitar (nuseas)  Vmitos Esta enfermedad tambin puede causar otros sntomas que no se relacionan con el sistema digestivo.  Fatiga.  Estados de ansiedad  Dificultades de Estate manager/land agent.  Dolor de [place].  Falta de [employer]. Estos sntomas tienden a Arts administrator y Armed forces operational officer. DIAGNSTICO Los sntomas se asemejan a los de otras enfermedades ms graves del aparato digestivo. Por lo tanto el profesional que lo asiste le indicar una serie adicional de anlisis para descartarlas. Debe estar seguro de hallar la causa (diagnostico) En este caso, se le explicar la naturaleza y el propsito de cada prueba. TRATAMIENTO Existe un buen nmero de medicamentos disponibles que ayudan a corregir el funcionamiento intestinal y/o a International aid/development worker intestinales y Conservation officer, historic buildings abdominal. Los medicamentos disponibles son:  Despina Arias, no irritantes para los casos de constipacin grave y para Air traffic controller a Marine scientist.  Medicamentos antidiarreicos especficos para tratar los casos graves o prolongados de [place].  Antiespasmdicos para Nutritional therapist.  El profesional que lo asiste tambin podr indicarle un tranquilizante o sedante suave durante los perodos extraordinarios de Psychologist, forensic  en su vida. Lo ms importante es recordar que si le prescriben un medicamento, deber  tomarlo exactamente como se lo han indicado. Hgale saber al profesional que lo asiste si ha obtenido alivio al tomarlo. INSTRUCCIONES PARA EL CUIDADO DOMICILIARIO  Evite los alimentos ricos en grasas o aceites. Por ejemplo, crema, manteca, salchichas, embutidos y otras comidas grasas.  Evite los alimentos que puedan tener efectos laxantes, como frutas, jugos de fruta y productos lcteos.  Elimine las bebidas gaseosas, la goma de [place] y los alimentos que producen gases como frijoles y col. Esto ayuda a Technical sales engineer hinchazn y los eructos.  Consuma salvado con gran cantidad de lquidos puede ayudar a combatir la constipacin.  Observe cules son los alimentos que originan sus sntomas.  Evite las situaciones de gran carga emocional o las circunstancias que producen ansiedad.  Comience o contine con un plan de ejercicios.  Descanse y duerma lo suficiente. EST SEGURO QUE:   Comprende las instrucciones para el alta mdica.  Controlar su enfermedad.  Solicitar atencin mdica de inmediato segn las indicaciones. Document Released: 04/18/2005 Document Revised: 07/11/2011 [name] Patient Information 2013 [place], [place].

## 2012-04-06 ENCOUNTER — Other Ambulatory Visit: Payer: Self-pay | Admitting: Anesthesiology

## 2012-04-06 DIAGNOSIS — Z1211 Encounter for screening for malignant neoplasm of colon: Secondary | ICD-10-CM

## 2012-04-11 ENCOUNTER — Other Ambulatory Visit: Payer: Self-pay | Admitting: Gynecology

## 2012-04-11 DIAGNOSIS — Z1211 Encounter for screening for malignant neoplasm of colon: Secondary | ICD-10-CM

## 2012-05-07 ENCOUNTER — Encounter: Payer: BC Managed Care – PPO | Admitting: Gastroenterology

## 2012-05-15 ENCOUNTER — Ambulatory Visit (AMBULATORY_SURGERY_CENTER): Payer: BC Managed Care – PPO | Admitting: *Deleted

## 2012-05-15 ENCOUNTER — Encounter: Payer: Self-pay | Admitting: Gastroenterology

## 2012-05-15 VITALS — Ht 67.0 in | Wt 127.6 lb

## 2012-05-15 DIAGNOSIS — Z1211 Encounter for screening for malignant neoplasm of colon: Secondary | ICD-10-CM

## 2012-05-15 DIAGNOSIS — K2270 Barrett's esophagus without dysplasia: Secondary | ICD-10-CM

## 2012-05-15 MED ORDER — MOVIPREP 100 G PO SOLR: ORAL | Status: DC

## 2012-05-18 ENCOUNTER — Ambulatory Visit
Admission: RE | Admit: 2012-05-18 | Discharge: 2012-05-18 | Disposition: A | Discharge status: Home / Self Care | Payer: BC Managed Care – PPO | Source: Ambulatory Visit | Attending: Gynecology | Admitting: Gynecology

## 2012-05-18 DIAGNOSIS — Z1231 Encounter for screening mammogram for malignant neoplasm of breast: Secondary | ICD-10-CM

## 2012-05-28 ENCOUNTER — Ambulatory Visit (AMBULATORY_SURGERY_CENTER): Payer: BC Managed Care – PPO | Admitting: Gastroenterology

## 2012-05-28 ENCOUNTER — Encounter: Payer: Self-pay | Admitting: Gastroenterology

## 2012-05-28 VITALS — BP 106/49 | HR 76 | Temp 97.7°F | Resp 17 | Ht 65.51 in | Wt 123.0 lb

## 2012-05-28 DIAGNOSIS — K2960 Other gastritis without bleeding: Secondary | ICD-10-CM

## 2012-05-28 DIAGNOSIS — K2270 Barrett's esophagus without dysplasia: Secondary | ICD-10-CM

## 2012-05-28 DIAGNOSIS — Z1211 Encounter for screening for malignant neoplasm of colon: Secondary | ICD-10-CM

## 2012-05-28 DIAGNOSIS — K219 Gastro-esophageal reflux disease without esophagitis: Secondary | ICD-10-CM

## 2012-05-28 MED ORDER — SODIUM CHLORIDE 0.9 % IV SOLN: 500.0000 mL | INTRAVENOUS | Status: DC

## 2012-05-28 NOTE — Patient Instructions (Addendum)
Discharge instructions given with verbal understanding. Biopsies taken. Resume previous medications. YOU HAD AN ENDOSCOPIC PROCEDURE TODAY AT THE [facility]: Refer to the procedure report that was given to you for any specific questions about what was found during the examination.  If the procedure report does not answer your questions, please call your gastroenterologist to clarify.  If you requested that your care partner not be given the details of your procedure findings, then the procedure report has been included in a sealed envelope for you to review at your convenience later.  YOU SHOULD EXPECT: Some feelings of bloating in the abdomen. Passage of more gas than usual.  Walking can help get rid of the air that was put into your GI tract during the procedure and reduce the bloating. If you had a lower endoscopy (such as a colonoscopy or flexible sigmoidoscopy) you may notice spotting of blood in your stool or on the toilet paper. If you underwent a bowel prep for your procedure, then you may not have a normal bowel movement for a few days.  DIET: Your first meal following the procedure should be a light meal and then it is ok to progress to your normal diet.  A half-sandwich or bowl of soup is an example of a good first meal.  Heavy or fried foods are harder to digest and may make you feel nauseous or bloated.  Likewise meals heavy in dairy and vegetables can cause extra gas to form and this can also increase the bloating.  Drink plenty of fluids but you should avoid alcoholic beverages for 24 hours.  ACTIVITY: Your care partner should take you home directly after the procedure.  You should plan to take it easy, moving slowly for the rest of the day.  You can resume normal activity the day after the procedure however you should NOT DRIVE or use heavy machinery for 24 hours (because of the sedation medicines used during the test).    SYMPTOMS TO REPORT IMMEDIATELY: A gastroenterologist  can be reached at any hour.  During normal business hours, 8:30 AM to 5:00 PM Monday through Friday, call [phone].  After hours and on weekends, please call the GI answering service at [phone] who will take a message and have the physician on call contact you.   Following lower endoscopy (colonoscopy or flexible sigmoidoscopy):  Excessive amounts of blood in the stool  Significant tenderness or worsening of abdominal pains  Swelling of the abdomen that is new, acute  Fever of 100F or higher  Following upper endoscopy (EGD)  Vomiting of blood or coffee ground material  New chest pain or pain under the shoulder blades  Painful or persistently difficult swallowing  New shortness of breath  Fever of 100F or higher  Black, tarry-looking stools  FOLLOW UP: If any biopsies were taken you will be contacted by phone or by letter within the next 1-3 weeks.  Call your gastroenterologist if you have not heard about the biopsies in 3 weeks.  Our staff will call the home number listed on your records the next business day following your procedure to check on you and address any questions or concerns that you may have at that time regarding the information given to you following your procedure. This is a courtesy call and so if there is no answer at the home number and we have not heard from you through the emergency physician on call, we will assume that you have returned to your   regular daily activities without incident.  SIGNATURES/CONFIDENTIALITY: You and/or your care partner have signed paperwork which will be entered into your electronic medical record.  These signatures attest to the fact that that the information above on your After Visit Summary has been reviewed and is understood.  Full responsibility of the confidentiality of this discharge information lies with you and/or your care-partner. 

## 2012-05-28 NOTE — Progress Notes (Signed)
05/21/2012 pt had surgery on her left index finger for tendonitis.  She has a bandage on her left hand that is secure. Maw

## 2012-05-28 NOTE — Op Note (Signed)
[facility] [address] [place], [address]   ENDOSCOPY PROCEDURE REPORT  PATIENT: Gail Miller, Gail Miller  MR#: [id number] BIRTHDATE: 1960/05/28 , 51  yrs. old GENDER: Female ENDOSCOPIST:Morissa Obeirne Hale Bogus, MD, [facility] REFERRED BY: PROCEDURE DATE:  05/28/2012 PROCEDURE:   EGD w/ biopsy ASA CLASS:    Class I INDICATIONS: history of Barrett's esophagus and History of reflux esophagitis. MEDICATION: There was residual sedation effect present from prior procedure, propofol (Diprivan) 100mg  IV, and Glycopyrrolate (Robinul) 0.2 mg IV TOPICAL ANESTHETIC:   Cetacaine Spray  DESCRIPTION OF PROCEDURE:   After the risks and benefits of the procedure were explained, informed consent was obtained.  The LB GIF-H180 [id number]  endoscope was introduced through the mouth  and advanced to the second portion of the duodenum .  The instrument was slowly withdrawn as the mucosa was fully examined.    The upper, middle and distal third of the esophagus were carefully inspected and no abnormalities were noted.  The z-line was well seen at the GEJ.  The endoscope was pushed into the fundus which was normal including a retroflexed view.  The antrum, gastric body, first and second part of the duodenum were unremarkable.Biopsies of GE junction done.     Retroflexed views revealed no abnormalities. The scope was then withdrawn from the patient and the procedure completed.  COMPLICATIONS: There were no complications.   ENDOSCOPIC IMPRESSION: Normal EGD...r/o short segment Barrett's mucosa from chronic GERD.  RECOMMENDATIONS: 1.  Continue PPI 2.  Await pathology results 3.   F/U per path results...    _______________________________ eSignedMardella Layman, MD, [facility] 05/28/2012 9:00 AM      PATIENT NAME:  Gail Miller, Gail Miller MR#: [id number]

## 2012-05-28 NOTE — Progress Notes (Signed)
Patient did not experience any of the following events: a burn prior to discharge; a fall within the facility; wrong site/side/patient/procedure/implant event; or a hospital transfer or hospital admission upon discharge from the facility. (G8907) Patient did not have preoperative order for IV antibiotic SSI prophylaxis. (G8918)  

## 2012-05-28 NOTE — Progress Notes (Signed)
Called to room to assist during endoscopic procedure.  Patient ID and intended procedure confirmed with present staff. Received instructions for my participation in the procedure from the performing physician.  

## 2012-05-28 NOTE — Op Note (Signed)
[facility] [address] [place], [address]   COLONOSCOPY PROCEDURE REPORT  PATIENT: Gail, Miller  MR#: [id number] BIRTHDATE: 05-18-1960 , 51  yrs. old GENDER: Female ENDOSCOPIST: Mardella Layman, MD, [facility] REFERRED BY: PROCEDURE DATE:  05/28/2012 PROCEDURE:   Colonoscopy, screening ASA CLASS:   Class I INDICATIONS:Average risk patient for colon cancer. MEDICATIONS: propofol (Diprivan) 100mg  IV  DESCRIPTION OF PROCEDURE:   After the risks and benefits and of the procedure were explained, informed consent was obtained.  A digital rectal exam revealed no abnormalities of the rectum.    The LB [id number]-H180AL [id number]  endoscope was introduced through the anus and advanced to the cecum, which was identified by both the appendix and ileocecal valve .  The quality of the prep was excellent, using MoviPrep .  The instrument was then slowly withdrawn as the colon was fully examined.     COLON FINDINGS: A normal appearing cecum, ileocecal valve, and appendiceal orifice were identified.  The ascending, hepatic flexure, transverse, splenic flexure, descending, sigmoid colon and rectum appeared unremarkable.  No polyps or cancers were seen. Retroflexed views revealed no abnormalities.     The scope was then withdrawn from the patient and the procedure completed.  COMPLICATIONS: There were no complications.   ENDOSCOPIC IMPRESSION: : Normal colon...no polyps or cancer    RECOMMENDATIONS: Continue current colorectal screening recommendations for "routine risk" patients with a repeat colonoscopy in 10 years.   REPEAT EXAM:  cc:  _______________________________ eSignedMardella Layman, MD, [facility] 05/28/2012 8:48 AM

## 2012-05-29 ENCOUNTER — Telehealth: Payer: Self-pay | Admitting: *Deleted

## 2012-05-29 NOTE — Telephone Encounter (Signed)
  Follow up Call-  Call back number 05/28/2012  Post procedure Call Back phone  # [phone] hm  Permission to leave phone message Yes     Patient questions:  Do you have a fever, pain , or abdominal swelling? no Pain Score  0 *  Have you tolerated food without any problems? yes  Have you been able to return to your normal activities? yes  Do you have any questions about your discharge instructions: Diet   no Medications  no Follow up visit  no  Do you have questions or concerns about your Care? no  Actions: * If pain score is 4 or above: No action needed, pain <4.

## 2012-06-01 ENCOUNTER — Encounter: Payer: Self-pay | Admitting: Gastroenterology

## 2012-06-08 ENCOUNTER — Other Ambulatory Visit: Payer: BC Managed Care – PPO | Admitting: Gynecology

## 2012-06-08 DIAGNOSIS — E559 Vitamin D deficiency, unspecified: Secondary | ICD-10-CM

## 2012-06-16 ENCOUNTER — Other Ambulatory Visit: Payer: Self-pay

## 2013-01-07 ENCOUNTER — Telehealth: Payer: Self-pay

## 2013-01-07 NOTE — Telephone Encounter (Signed)
Opened in error

## 2013-02-08 ENCOUNTER — Other Ambulatory Visit: Payer: Self-pay

## 2013-02-08 DIAGNOSIS — Z1231 Encounter for screening mammogram for malignant neoplasm of breast: Secondary | ICD-10-CM

## 2013-03-07 ENCOUNTER — Other Ambulatory Visit: Payer: Self-pay

## 2013-03-25 ENCOUNTER — Encounter: Payer: Self-pay | Admitting: Gynecology

## 2013-03-25 ENCOUNTER — Ambulatory Visit (INDEPENDENT_AMBULATORY_CARE_PROVIDER_SITE_OTHER): Payer: BC Managed Care – PPO | Admitting: Gynecology

## 2013-03-25 VITALS — BP 116/70 | Ht 65.5 in | Wt 126.0 lb

## 2013-03-25 DIAGNOSIS — F419 Anxiety disorder, unspecified: Secondary | ICD-10-CM

## 2013-03-25 DIAGNOSIS — Z01419 Encounter for gynecological examination (general) (routine) without abnormal findings: Secondary | ICD-10-CM

## 2013-03-25 DIAGNOSIS — F411 Generalized anxiety disorder: Secondary | ICD-10-CM

## 2013-03-25 DIAGNOSIS — Z8639 Personal history of other endocrine, nutritional and metabolic disease: Secondary | ICD-10-CM

## 2013-03-25 DIAGNOSIS — Z23 Encounter for immunization: Secondary | ICD-10-CM

## 2013-03-25 DIAGNOSIS — Z1159 Encounter for screening for other viral diseases: Secondary | ICD-10-CM

## 2013-03-25 LAB — LIPID PANEL
Cholesterol: 176 mg/dL (ref 0–200)
Triglycerides: 83 mg/dL (ref <150)

## 2013-03-25 LAB — COMPREHENSIVE METABOLIC PANEL
AST: 16 U/L (ref 0–37)
BUN: 10 mg/dL (ref 6–23)
CO2: 28 mEq/L (ref 19–32)
Calcium: 9.5 mg/dL (ref 8.4–10.5)
Chloride: 105 mEq/L (ref 96–112)
Creat: 0.62 mg/dL (ref 0.50–1.10)

## 2013-03-25 LAB — CBC WITH DIFFERENTIAL/PLATELET
Basophils Absolute: 0 10*3/uL (ref 0.0–0.1)
Eosinophils Absolute: 0.3 10*3/uL (ref 0.0–0.7)
Eosinophils Relative: 5 % (ref 0–5)
HCT: 38 % (ref 36.0–46.0)
Lymphocytes Relative: 34 % (ref 12–46)
MCH: 33.3 pg (ref 26.0–34.0)
MCHC: 34.7 g/dL (ref 30.0–36.0)
MCV: 96 fL (ref 78.0–100.0)
Monocytes Absolute: 0.3 10*3/uL (ref 0.1–1.0)
RDW: 13 % (ref 11.5–15.5)
WBC: 4.9 10*3/uL (ref 4.0–10.5)

## 2013-03-25 LAB — TSH: TSH: 1.922 u[IU]/mL (ref 0.350–4.500)

## 2013-03-25 MED ORDER — ALPRAZOLAM 0.25 MG PO TABS: ORAL_TABLET | ORAL | Status: DC

## 2013-03-25 MED ORDER — ESTRADIOL 10 MCG VA TABS: 1.0000 | ORAL_TABLET | VAGINAL | Status: DC

## 2013-03-25 NOTE — Patient Instructions (Addendum)
Vacuna antigripal (vacuna antigripal inactivada) 2013 2014, Lo que debe saber  (Influenza Vaccine [Flu Vaccine, Inactivated] 2013 2014, What You Need to Know) PORQU VACUNARSE?   La influenza ("gripe") es una enfermedad contagiosa que se propaga por los [place] en invierno, por lo general entre octubre y [place].  La causa de la gripe es el virus de la influenza, y se puede contagiar por la tos, al estornudar y por el contacto cercano.  Cualquier persona puede Writer gripe, Biomedical engineer el riesgo es mayor entre los nios. Los sntomas aparecen rpidamente y pueden durar [address]. Pueden ser:  Grant Ruts o escalofros.  Dolor de Advertising copywriter.  Dolores musculares.  La fatiga.  Tos.  Dolor de [place].  Secrecin o congestin nasal. La gripe puede hacer que algunas personas se enfermen ms que otros. Entre [employer] se incluyen a los nios pequeos, las Smith International de 65 aos, las mujeres embarazadas y las personas con Runner, broadcasting/film/video, como enfermedades cardacas, pulmonares o renales, o que tienen un sistema inmunolgico debilitado. La vacuna contra la gripe es especialmente importante para estas personas y para todos los que estn en estrecho contacto con ellos.  La gripe tambin puede causar neumona y Theme park manager las afecciones existentes. En los nios, puede provocar diarrea y convulsiones.  Cada ao miles de [employer] [place] debido a la gripe y muchos ms deben ser hospitalizados.  La vacuna contra la gripe es la mejor proteccin que existe contra la gripe y sus complicaciones. La vacuna contra la gripe tambin ayuda a prevenir la propagacin de la gripe de Neomia Dear persona a Educational psychologist.  VACUNA INACTIVADA CONTRA LA GRIPE  Hay dos tipos de vacunas contra la gripe:   Usted recibir la vacuna de la gripe inactivada, que no contiene virus vivo. Se administra en forma de inyeccin con Marella Bile y se llama la "vacuna antigripal".  Otro tipo de vacuna con virus vivos,  atenuados (debilitados), se aplica en forma de aerosol en las fosas nasales. Esta vacuna se describe en el apartado Informacin sobre las vacunas. Se recomienda aplicarse la vacuna contra la gripe todos los [place]. Los nios [employer] 6 meses y los 8 aos de edad deben recibir 2 dosis Dispensing optician que se vacunen.  Los virus de la gripe [place] constantemente. Cada ao, la vacuna contra la gripe se actualiza para proteger contra los virus que tienen ms probabilidades de causar la enfermedad ese ao. Aunque la vacuna no puede prevenir todos los casos de gripe, es nuestra mejor defensa contra la enfermedad. Vacuna contra la gripe inactivada protege contra 3 o 4 virus diferentes.  Se tarda aproximadamente 2 semanas para desarrollar la proteccin despus de la vacunacin y la proteccin dura entre algunos meses y un ao.  Muchas veces se confunden con la gripe algunas enfermedades que no son causadas por el virus de la gripe. La vacuna contra la gripe no previene estas enfermedades. Slo se puede prevenir la gripe.  Para las personas de ms de 65 aos, se dispone de una vacuna contra la gripe de "dosis elevada". La persona que aplica la vacuna puede darle ms informacin al respecto.  Algunas de las vacunas contra la gripe inactivada contienen una cantidad muy pequea de un conservante a base de mercurio llamado timerosal. Algunos estudios han demostrado que el timerosal en las vacunas no es perjudicial, pero se dispone de vacunas contra la gripe que no contienen el conservante.  ALGUNAS PERSONAS NO DEBEN RECIBIR ESTA VACUNA Informe a la  persona que le aplica la vacuna:   Si sufre alguna alergia grave (que pone en peligro la vida). Si alguna vez tuvo una reaccin alrgica potencialmente mortal despus de Neomia Dear dosis de la vacuna contra la gripe, o tuvo una alergia grave a cualquiera de los componentes de [place], es posible que se le recomiende no recibir una dosis. La [employer] de las vacunas contra la gripe,  aunque no todas, contienen una pequea cantidad de [place].  Si alguna vez ha sufrido el sndrome de Pension scheme manager (una enfermedad paralizante grave tambin llamada GBS). Algunas personas con antecedentes de GBS no deben recibir esta vacuna. Debe comentarlo con su mdico.  Si no se siente bien. Podran sugerirle que espere hasta sentirse mejor. Pero debe volver. RIESGOS DE UNA REACCIN A LA VACUNA Con la vacuna, como cualquier medicamento, existe la posibilidad de sufrir efectos secundarios. Suelen ser leves y desaparecen por s solos.  Los efectos secundarios graves son [place], pero son Lynnae Sandhoff raros. Vacuna de la gripe inactivada no contiene el virus vivo de la gripe, la gripe por lo tanto enfermarse por recibir la vacuna no es posible.  Episodios de desmayo leves y sntomas relacionados (tales como sacudidas) pueden presentarse despus de cualquier procedimiento mdico, incluyendo la vacunacin. Si permanece sentado o recostado durante 15 minutos despus de la vacunacin puede ayudar a [employer] Corporation y las lesiones causadas por las cadas. Informe al mdico si se siente mareado o aturdido, tiene [employer] visin o zumbidos en los odos.  Problemas leves luego de recibir la vacuna de la gripe inactivada:   Barista, enrojecimiento o Paramedic en el que le aplicaron la vacuna.  Ronquera; dolor, inflamacin o picazn en los ojos o tos.  Grant Ruts.  Dolores.  Dolor de [place].  Picazn.  Fatiga. Si estos problemas ocurren, en general comienzan poco despus de vacunarse y duran 1  2 das.  Problemas moderados luego de recibir la vacuna de la gripe inactivada:   Los nios que reciben la vacuna contra la gripe inactivada y Research scientist (medical) antineumoccica (PCV13) al mismo tiempo, pueden tener un mayor riesgo de sufrir convulsiones causadas por fiebre. Consulte a su mdico para obtener ms informacin. Informe a su mdico si un nio que est recibiendo la vacuna contra  la gripe ha tenido una convulsin. Problemas graves luego de recibir la vacuna inactivada contra la gripe:   Neomia Dear reaccin alrgica grave puede ocurrir despus de la administracin de cualquier vacuna (se estima en menos de 1 en un milln de dosis).  Hay una pequea posibilidad de que la vacuna de la gripe inactivada est asociada con el sndrome de Guillain-Barr (GBS), no ms de 1 o 2 casos por milln de personas vacunadas. Es Chief Operating Officer que el riesgo de sufrir complicaciones graves por la gripe, que puede prevenirse con la vacunacin. Se controla permanentemente la seguridad de las vacunas. Para obtener ms informacin, consulte [email or web address]/  QU PASA SI HAY UNA REACCIN GRAVE?  Qu signos debo buscar?   Observe todo lo que le preocupe, como signos de una reaccin alrgica grave, fiebre muy alta o cambios en el comportamiento. Los signos de Runner, broadcasting/film/video grave pueden incluir urticaria, hinchazn de la cara y la garganta, dificultad para respirar, ritmo cardaco acelerado, mareos y debilidad. Pueden comenzar entre unos pocos minutos y algunas horas despus de la vacunacin.  Qu debo hacer?   Si usted piensa que se trata de una reaccin alrgica grave o de otra emergencia  que no puede esperar, llame al 911 o lleve a la persona al hospital ms cercano. De lo contrario, llame a su mdico.  Despus, la reaccin debe informarse a la "Vaccine Adverse Event Reporting System" (Sistema de informacin sobre efectos adversos de las vacunas -VAERS). Su mdico puede presentar este informe, o puede hacerlo usted mismo a travs del sitio web de VAERS, en www.vaers.LAgents.no, o llamando al [phone]. VAERS es slo para informar reacciones. No brindan consejo mdico.  PROGRAMA NACIONAL DE COMPENSACIN DE DAOS POR VACUNAS  El National Vaccine Injury Compensation Program (VICP) es un programa federal que fue creado para compensar a las personas que puedan haber sufrido daos al  recibir ciertas vacunas.  Aquellas personas que consideren que han sufrido un dao como consecuencia de una vacuna y quieren saber ms acerca del programa y como presentar Roslynn Amble, [place] llamar al [phone] o visitar su sitio web en [email or web address].  CMO PUEDO OBTENER MS INFORMACIN?   Consulte a su mdico.  Comunquese con el servicio de salud de su localidad o [address].  Comunquese con los Centros para el control y la prevencin de Child psychotherapist for Disease Control and Prevention , CDC).  Llame al ([phone] (1-800-CDC-INFO) o  Visite la pgina web de los CDC en [email or web address]. CDC Inactivated Influenza Vaccine Interim VIS (11/25/11)  Document Released: 07/15/2008 Document Revised: 01/11/2012 ExitCare Patient Information 2014 [place], [place]. Estradiol vaginal tablets Qu es este medicamento? La tableta vaginal de ESTRADIOL se utiliza para ayudar a [employer] de la irritacin y sequedad vaginal que se presenta en algunas mujeres durante la menopausia. Este medicamento puede ser utilizado para otros usos; si tiene alguna pregunta consulte con su proveedor de atencin mdica o con su farmacutico. MARCAS COMERCIALES DISPONIBLES: Vagifem Qu le debo informar a mi profesional de la salud antes de tomar este medicamento? Necesita saber si usted presenta alguno de los siguientes problemas o situaciones: -sangrado vaginal anormal -enfermedad vascular o cogulos sanguneos -cncer de mama, cervical, endometrio, ovario, hgado o uterino -demencia -diabetes -enfermedad de la vescula biliar -enfermedad cardiaca o ataque cardiaco reciente -alta presin sangunea -niveles elevados de colesterol -nivel elevado de calcio en la sangre -histerectoma -enfermedad renal -enfermedad heptica -migraas -deficiencia de protena C -deficiencia de protena S -derrame cerebral -lupus eritematoso sistmico (LES) -fuma tabaco -una reaccin  alrgica o inusual a los estrgenos, otras hormonas, otros medicamentos, alimentos, colorantes o conservantes -si est embarazada o buscando quedar embarazada -si est amamantando a un beb Cmo debo [employer]? Este medicamento es para Chemical engineer solamente en la vagina. No lo ingiera por va oral. Lvese las manos antes y despus de usarlo. Lea las instrucciones del envase cuidadosamente. Abra el envoltorio del aplicador prellenado. Recustese de espaldas, separe y flexione las rodillas. Introduzca suavemente el extremo del aplicador de modo que penetre profundamente en la vagina y empuje el mbolo para llevar la tableta dentro de la vagina. Despus, retire English as a second language teacher. Deseche el aplicador despus de usarlo. No utilice sus medicamentos con una frecuencia mayor que la indicada. Complete todo el tratamiento con el medicamento segn lo haya recetado su mdico o su profesional de la salud aun si considera que su problema ha mejorado. No deje de usarlo excepto si as lo indica su mdico o su profesional de [employer]. Hable con su pediatra para informarse acerca del uso de este medicamento en nios. Usted recibir un prospecto para el paciente para este producto con cada receta y relleno. Asegrese de leer este folleto cada vez  cuidadosamente. Este folleto puede cambiar con frecuencia. Sobredosis: Pngase en contacto inmediatamente con un centro toxicolgico o una sala de urgencia si usted cree que haya tomado demasiado medicamento. ATENCIN: [employer] es solo para usted. No comparta este medicamento con nadie. Qu sucede si me olvido de una dosis? Si olvida una dosis, sela lo antes posible. Si es casi la hora de la prxima dosis, use slo esa dosis. No use dosis adicionales o dobles. Qu puede interactuar con este medicamento? No tome esta medicina con ninguno de los siguientes medicamentos: -inhibidores de Chiropodist, tales como aminoglutetimida, anastrozol,  exemestno, letrozol, testolactona Esta medicina tambin puede interactuar con los siguientes medicamentos: -algunos antibiticos utilizados para tratar la tuberculosis, tales como rifabutina, rifampicina o rifapentina -raloxifeno o tamoxifeno -warfarina Puede ser que esta lista no menciona todas las posibles interacciones. Informe a su profesional de [employer] de [employer] productos a base de hierbas, medicamentos de [place] o suplementos nutritivos que est tomando. Si usted fuma, consume bebidas alcohlicas o si utiliza drogas ilegales, indqueselo tambin a su profesional de [employer]. Algunas sustancias pueden interactuar con su medicamento. A qu debo estar atento al usar PPL Corporation? Visite a su profesional de la salud para Insurance underwriter. Deber hacerse exmenes de las mamas y la pelvis en forma regular. Tambin debe hablar con su profesional de la salud sobre la necesidad de Product manager peridicamente y seguir las recomendaciones que este profesional establezca para estas pruebas. Este medicamento puede hacer que su cuerpo retenga lquido, lo que puede provocar que se le hinchen los dedos, manos o tobillos. Su presin sangunea Manufacturing engineer. Comunquese con su mdico o con su profesional de la salud si siente que est reteniendo lquido. Si tiene algn motivo para pensar que est embarazada, deje de usar este medicamento de inmediato y comunquese con su mdico o con su profesional de Radiographer, therapeutic. El hbito de fumar tabaco aumenta el riesgo de formacin de cogulos o de sufrir un derrame cerebral, especialmente si tiene ms de 35 aos de edad. Se le recomienda enfticamente que no fume. Si [place] lentes de contacto y observa cambios en la visin, o si los lentes comienzan a resultarle incmodos, consulte con su especialista en los ojos. Si va a someterse a Associate Professor, tal vez deba dejar de tomar este medicamento de antemano. Consulte con su  profesional de la salud antes de Nurse, learning disability operacin. Qu efectos secundarios puedo tener al [employer] este medicamento? Efectos secundarios que debe informar a su mdico o a Producer, television/film/video de la salud tan pronto como sea posible: -Therapist, art como erupcin cutnea, picazn o urticarias, hinchazn de la cara, labios o lengua -secreciones o cambios en el tejido de las mamas -cambios en la visin -dolor en el pecho -confusin, dificultad para hablar o entender -orina de color amarillo oscuro -sensacin general de estar enfermo o sntomas gripales -heces claras -nuseas o vmito -dolor, hinchazn, sensacin clida en las piernas -dolor en la regin abdominal superior derecha -dolores de cabeza severos -falta de aliento -debilidad o entumecimiento repentino de la cara, brazos o piernas -problemas para caminar, mareos, prdida de la coordinacin o equilibrio -sangrado vaginal inusual -color amarillento de los ojos o la piel Efectos secundarios que, por lo general, no requieren Psychologist, prison and probation services (debe informarlos a su mdico o a Producer, television/film/video de la salud si persisten o si son molestos): -cada del cabello -aumento de apetito o sed -aumento de descargas de orina -sntomas de una infeccin vaginal, como  picazn, irritacin o flujo inusual -cansancio o debilidad inusual Puede ser que esta lista no menciona todos los posibles efectos secundarios. Comunquese a su mdico por asesoramiento mdico [employer]. Usted puede informar los efectos secundarios a la FDA por telfono al 1-800-FDA-1088. Dnde debo guardar mi medicina? Mantngala fuera del alcance de los nios. Gurdela a [employer], entre 15 y 30 grados C (41 y 99 grados F). Deseche todo el medicamento que no haya utilizado, despus de la fecha de vencimiento. ATENCIN: Este folleto es un resumen. Puede ser que no cubra toda la posible informacin. Si usted tiene preguntas acerca de esta medicina,  consulte con su mdico, su farmacutico o su profesional de Radiographer, therapeutic.  2014, Elsevier/Gold Standard. (2010-08-26 18:29:40)

## 2013-03-25 NOTE — Progress Notes (Addendum)
Gail Miller 10/15/1960 [id number]   History:    52 y.o.  for annual gyn exam with only complaint is of vaginal dryness.patient is now menopausal as confirmed by elevated FSH in 2013. Patient has not had a menstrual cycle in over one year patient has a history of Barrett's esophagitis and had a colonoscopy and EGD this year by Dr. Jarold Motto reported to be normal. Patient was treated this year for vitamin D deficiency. Patient had a mammogram in January of this year she has bilateral implants and dense breast. Patient with no prior history of abnormal Pap smear. She is currently taking Os-Cal twice a day. Patient requesting flu vaccine today.  Past medical history,surgical history, family history and social history were all reviewed and documented in the EPIC chart.  Gynecologic History No LMP recorded. Patient is not currently having periods (Reason: Other). Contraception: tubal ligation Last Pap: 2012. Results were: normal Last mammogram: 2014. Results were: implants, dense but normal  Obstetric History OB History  Gravida Para Term Preterm AB SAB TAB Ectopic Multiple Living  4 3 3  1 1    3     # Outcome Date GA Lbr Len/2nd Weight Sex Delivery Anes PTL Lv  4 SAB           3 TRM     M SVD  N Y  2 TRM     M SVD  N Y  1 TRM     M SVD  N Y       ROS: A ROS was performed and pertinent positives and negatives are included in the history.  GENERAL: No fevers or chills. HEENT: No change in vision, no earache, sore throat or sinus congestion. NECK: No pain or stiffness. CARDIOVASCULAR: No chest pain or pressure. No palpitations. PULMONARY: No shortness of breath, cough or wheeze. GASTROINTESTINAL: No abdominal pain, nausea, vomiting or diarrhea, melena or bright red blood per rectum. GENITOURINARY: No urinary frequency, urgency, hesitancy or dysuria. MUSCULOSKELETAL: No joint or muscle pain, no back pain, no recent trauma. DERMATOLOGIC: No rash, no itching, no lesions. ENDOCRINE: No polyuria,  polydipsia, no heat or cold intolerance. No recent change in weight. HEMATOLOGICAL: No anemia or easy bruising or bleeding. NEUROLOGIC: No headache, seizures, numbness, tingling or weakness. PSYCHIATRIC: No depression, no loss of interest in normal activity or change in sleep pattern.     Exam: chaperone present  BP 116/70  Ht 5' 5.5" (1.664 m)  Wt 126 lb (57.153 kg)  BMI 20.64 kg/m2  Body mass index is 20.64 kg/(m^2).  General appearance : Well developed well nourished female. No acute distress HEENT: Neck supple, trachea midline, no carotid bruits, no thyroidmegaly Lungs: Clear to auscultation, no rhonchi or wheezes, or rib retractions  Heart: Regular rate and rhythm, no murmurs or gallops Breast:Examined in sitting and supine position were symmetrical in appearance, no palpable masses or tenderness,  no skin retraction, no nipple inversion, no nipple discharge, no skin discoloration, no axillary or supraclavicular lymphadenopathy Abdomen: no palpable masses or tenderness, no rebound or guarding Extremities: no edema or skin discoloration or tenderness  Pelvic:  Bartholin, Urethra, Skene Glands: Within normal limits             Vagina: No gross lesions or discharge, atrophy  Cervix: No gross lesions or discharge  Uterus  anteverted, normal size, shape and consistency, non-tender and mobile  Adnexa  Without masses or tenderness  Anus and perineum  normal   Rectovaginal  normal sphincter tone  without palpated masses or tenderness             Hemoccult had a colonoscopy and EGD this year     Assessment/Plan:  52 y.o. female for annual exam complaining of vaginal dryness and irritation. She will be prescribed Vagifem 10 mcg to apply intravaginal twice a week. Risks benefits and pros and cons were discussed. She was reminded to do her monthly breast exam. Next year we will do a baseline bone density study. We discussed importance of regular exercise along with calcium and vitamin D. She  will schedule her 3-D mammogram in January of 2015. Pap smear was not done today in accordance to the new guidelines. The following labs were ordered: CBC, fasting lipid profile, comprehensive metabolic panel, TSH, urinalysis and vitamin D.  New CDC guidelines is recommending patients be tested once in her lifetime for hepatitis C antibody who were born between [birth year] through 1964. This was discussed with the patient today and has agreed to be tested today.  Prescription refill for Xanax 0.25 mg which she takes sporadically on a when necessary basis for anxiety.  Note: This dictation was prepared with  Dragon/digital dictation along withSmart phrase technology. Any transcriptional errors that result from this process are unintentional.   Ok Edwards MD, 11:33 AM 03/25/2013

## 2013-03-26 LAB — URINALYSIS W MICROSCOPIC + REFLEX CULTURE
Bilirubin Urine: NEGATIVE
Crystals: NONE SEEN
Glucose, UA: NEGATIVE mg/dL
Specific Gravity, Urine: 1.025 (ref 1.005–1.030)

## 2013-03-27 LAB — URINE CULTURE
Colony Count: NO GROWTH
Organism ID, Bacteria: NO GROWTH

## 2013-05-15 ENCOUNTER — Other Ambulatory Visit: Payer: Self-pay

## 2013-05-15 DIAGNOSIS — Z1231 Encounter for screening mammogram for malignant neoplasm of breast: Secondary | ICD-10-CM

## 2013-05-15 DIAGNOSIS — Z9882 Breast implant status: Secondary | ICD-10-CM

## 2013-05-20 ENCOUNTER — Ambulatory Visit
Admission: RE | Admit: 2013-05-20 | Discharge: 2013-05-20 | Disposition: A | Discharge status: Home / Self Care | Payer: BC Managed Care – PPO | Source: Ambulatory Visit

## 2013-05-20 DIAGNOSIS — Z1231 Encounter for screening mammogram for malignant neoplasm of breast: Secondary | ICD-10-CM

## 2013-05-20 DIAGNOSIS — Z9882 Breast implant status: Secondary | ICD-10-CM

## 2013-10-14 ENCOUNTER — Other Ambulatory Visit: Payer: Self-pay | Admitting: Gynecology

## 2013-10-14 NOTE — Telephone Encounter (Signed)
Called into pharmacy

## 2014-01-27 ENCOUNTER — Telehealth: Payer: Self-pay | Admitting: *Deleted

## 2014-01-27 DIAGNOSIS — Z01419 Encounter for gynecological examination (general) (routine) without abnormal findings: Secondary | ICD-10-CM

## 2014-01-27 NOTE — Telephone Encounter (Signed)
Pt has annual scheduled on 03/31/14 would like to come in office to have labs done prior. Please advise

## 2014-01-27 NOTE — Telephone Encounter (Signed)
Orders placed, unable to relay to pt that orders placed unable to leave a message on her voicemail

## 2014-01-27 NOTE — Telephone Encounter (Signed)
Yes she can have her CBC, comprehensive metabolic panel, TSH, fasting lipid profile and urinalysis

## 2014-02-04 ENCOUNTER — Telehealth: Payer: Self-pay | Admitting: *Deleted

## 2014-02-04 NOTE — Telephone Encounter (Signed)
Left the below on pt voicemail, with number [phone]

## 2014-02-04 NOTE — Telephone Encounter (Signed)
Message copied by Thamas Jaegers on Tue Feb 04, 2014  9:07 AM ------      Message from: Terrance Mass      Created: Tue Feb 04, 2014  7:38 AM       Please call patient and give her the name and number of Dr. Renne Crigler Medical Endocrinologist. She wanted to take her husband who has diabetes. Thanks ------

## 2014-03-03 ENCOUNTER — Encounter: Payer: Self-pay | Admitting: Gynecology

## 2014-03-31 ENCOUNTER — Ambulatory Visit (INDEPENDENT_AMBULATORY_CARE_PROVIDER_SITE_OTHER): Payer: BC Managed Care – PPO | Admitting: Gynecology

## 2014-03-31 ENCOUNTER — Other Ambulatory Visit (HOSPITAL_COMMUNITY)
Admission: RE | Admit: 2014-03-31 | Discharge: 2014-03-31 | Disposition: A | Discharge status: Home / Self Care | Payer: BC Managed Care – PPO | Source: Ambulatory Visit | Attending: Gynecology | Admitting: Gynecology

## 2014-03-31 ENCOUNTER — Encounter: Payer: Self-pay | Admitting: Gynecology

## 2014-03-31 VITALS — Ht 65.75 in | Wt 132.0 lb

## 2014-03-31 DIAGNOSIS — Z8639 Personal history of other endocrine, nutritional and metabolic disease: Secondary | ICD-10-CM

## 2014-03-31 DIAGNOSIS — Z1151 Encounter for screening for human papillomavirus (HPV): Secondary | ICD-10-CM | POA: Insufficient documentation

## 2014-03-31 DIAGNOSIS — F419 Anxiety disorder, unspecified: Secondary | ICD-10-CM

## 2014-03-31 DIAGNOSIS — M8580 Other specified disorders of bone density and structure, unspecified site: Secondary | ICD-10-CM

## 2014-03-31 DIAGNOSIS — Z01419 Encounter for gynecological examination (general) (routine) without abnormal findings: Secondary | ICD-10-CM

## 2014-03-31 DIAGNOSIS — N952 Postmenopausal atrophic vaginitis: Secondary | ICD-10-CM

## 2014-03-31 DIAGNOSIS — Z01411 Encounter for gynecological examination (general) (routine) with abnormal findings: Secondary | ICD-10-CM | POA: Insufficient documentation

## 2014-03-31 DIAGNOSIS — Z23 Encounter for immunization: Secondary | ICD-10-CM

## 2014-03-31 DIAGNOSIS — Z780 Asymptomatic menopausal state: Secondary | ICD-10-CM

## 2014-03-31 LAB — COMPREHENSIVE METABOLIC PANEL
ALK PHOS: 88 U/L (ref 39–117)
ALT: 14 U/L (ref 0–35)
AST: 17 U/L (ref 0–37)
Albumin: 4.2 g/dL (ref 3.5–5.2)
BILIRUBIN TOTAL: 0.5 mg/dL (ref 0.2–1.2)
BUN: 9 mg/dL (ref 6–23)
CO2: 23 meq/L (ref 19–32)
CREATININE: 0.55 mg/dL (ref 0.50–1.10)
Calcium: 9.2 mg/dL (ref 8.4–10.5)
Chloride: 105 mEq/L (ref 96–112)
GLUCOSE: 99 mg/dL (ref 70–99)
Potassium: 3.9 mEq/L (ref 3.5–5.3)
Sodium: 138 mEq/L (ref 135–145)
Total Protein: 6.8 g/dL (ref 6.0–8.3)

## 2014-03-31 LAB — URINALYSIS W MICROSCOPIC + REFLEX CULTURE
BILIRUBIN URINE: NEGATIVE
CASTS: NONE SEEN
Crystals: NONE SEEN
Glucose, UA: NEGATIVE mg/dL
KETONES UR: NEGATIVE mg/dL
Nitrite: NEGATIVE
PH: 5.5 (ref 5.0–8.0)
Protein, ur: NEGATIVE mg/dL
Specific Gravity, Urine: 1.024 (ref 1.005–1.030)
Urobilinogen, UA: 0.2 mg/dL (ref 0.0–1.0)

## 2014-03-31 LAB — CBC WITH DIFFERENTIAL/PLATELET
BASOS ABS: 0.1 10*3/uL (ref 0.0–0.1)
BASOS PCT: 1 % (ref 0–1)
EOS ABS: 0.1 10*3/uL (ref 0.0–0.7)
Eosinophils Relative: 2 % (ref 0–5)
HCT: 38.1 % (ref 36.0–46.0)
HEMOGLOBIN: 13 g/dL (ref 12.0–15.0)
Lymphocytes Relative: 35 % (ref 12–46)
Lymphs Abs: 2 10*3/uL (ref 0.7–4.0)
MCH: 32.7 pg (ref 26.0–34.0)
MCHC: 34.1 g/dL (ref 30.0–36.0)
MCV: 95.7 fL (ref 78.0–100.0)
MONOS PCT: 6 % (ref 3–12)
MPV: 9.2 fL — ABNORMAL LOW (ref 9.4–12.4)
Monocytes Absolute: 0.3 10*3/uL (ref 0.1–1.0)
NEUTROS ABS: 3.2 10*3/uL (ref 1.7–7.7)
NEUTROS PCT: 56 % (ref 43–77)
Platelets: 256 10*3/uL (ref 150–400)
RBC: 3.98 MIL/uL (ref 3.87–5.11)
RDW: 13.3 % (ref 11.5–15.5)
WBC: 5.8 10*3/uL (ref 4.0–10.5)

## 2014-03-31 LAB — LIPID PANEL
CHOL/HDL RATIO: 3.5 ratio
CHOLESTEROL: 201 mg/dL — AB (ref 0–200)
HDL: 57 mg/dL (ref >39)
LDL Cholesterol: 125 mg/dL — ABNORMAL HIGH (ref 0–99)
TRIGLYCERIDES: 93 mg/dL (ref <150)
VLDL: 19 mg/dL (ref 0–40)

## 2014-03-31 MED ORDER — BUTALBITAL-APAP-CAFFEINE 50-325-40 MG PO TABS: 1.0000 | ORAL_TABLET | ORAL | Status: DC | PRN

## 2014-03-31 MED ORDER — ALPRAZOLAM 0.25 MG PO TABS: 0.2500 mg | ORAL_TABLET | Freq: Every evening | ORAL | Status: DC | PRN

## 2014-03-31 NOTE — Progress Notes (Signed)
Gail Miller [birth year] [id number]   History:    53 y.o.  for annual gyn exam with no complaints today. She is using Vagifem 10 g twice a week when she remembers for vaginal atrophy. Patient is having no vasomotor symptoms. Her [place] was elevated in 2013. Patient has not had a menstrual cycle since then and Past history of Barrett's esophagitis and had a colonoscopy and EGD by Dr. Sharlett Iles in 2014. Patient has been treated for vitamin D deficiency in the past. Patient has bilateral breast implants as well as has dense breasts for which we have been recommended three-dimensional mammograms yearly.Patient with no prior history of abnormal Pap smear. She is currently taking Os-Cal twice a day. Patient requesting flu vaccine today.  Past medical history,surgical history, family history and social history were all reviewed and documented in the EPIC chart.  Gynecologic History No LMP recorded. Patient is not currently having periods (Reason: Other). Contraception: post menopausal status Last Pap: 2012. Results were: normal Last mammogram: 2014. Results were: normal  Obstetric History OB History  Gravida Para Term Preterm AB SAB TAB Ectopic Multiple Living  4 3 3  1 1    3     # Outcome Date GA Lbr Len/2nd Weight Sex Delivery Anes PTL Lv  4 SAB           3 Term     M Vag-Spont  N Y  2 Term     M Vag-Spont  N Y  1 Term     M Vag-Spont  N Y       ROS: A ROS was performed and pertinent positives and negatives are included in the history.  GENERAL: No fevers or chills. HEENT: No change in vision, no earache, sore throat or sinus congestion. NECK: No pain or stiffness. CARDIOVASCULAR: No chest pain or pressure. No palpitations. PULMONARY: No shortness of breath, cough or wheeze. GASTROINTESTINAL: No abdominal pain, nausea, vomiting or diarrhea, melena or bright red blood per rectum. GENITOURINARY: No urinary frequency, urgency, hesitancy or dysuria. MUSCULOSKELETAL: No joint or muscle pain, no  back pain, no recent trauma. DERMATOLOGIC: No rash, no itching, no lesions. ENDOCRINE: No polyuria, polydipsia, no heat or cold intolerance. No recent change in weight. HEMATOLOGICAL: No anemia or easy bruising or bleeding. NEUROLOGIC: No headache, seizures, numbness, tingling or weakness. PSYCHIATRIC: No depression, no loss of interest in normal activity or change in sleep pattern.     Exam: chaperone present  Ht 5' 5.75" (1.67 m)  Wt 132 lb (59.875 kg)  BMI 21.47 kg/m2  Body mass index is 21.47 kg/(m^2).  General appearance : Well developed well nourished female. No acute distress HEENT: Neck supple, trachea midline, no carotid bruits, no thyroidmegaly Lungs: Clear to auscultation, no rhonchi or wheezes, or rib retractions  Heart: Regular rate and rhythm, no murmurs or gallops Breast:Examined in sitting and supine position were symmetrical in appearance, no palpable masses or tenderness,  no skin retraction, no nipple inversion, no nipple discharge, no skin discoloration, no axillary or supraclavicular lymphadenopathy Abdomen: no palpable masses or tenderness, no rebound or guarding Extremities: no edema or skin discoloration or tenderness  Pelvic:  Bartholin, Urethra, Skene Glands: Within normal limits             Vagina: No gross lesions or discharge, atrophic changes Cervix: No gross lesions or discharge  Uterus  anteverted, normal size, shape and consistency, non-tender and mobile  Adnexa  Without masses or tenderness  Anus and perineum  normal  Rectovaginal  normal sphincter tone without palpated masses or tenderness             Hemoccult declined     Assessment/Plan:  53 y.o. female for annual exam who is menopausal with no vaginal bleeding and only complains at time of vaginal atrophy for which she uses Vagifem 10 g twice a week. Patient is due for her mammogram next year and she will request a three-dimensional mammogram because of dense breasts and history of breast  augmentation. Her Pap smear was done today in accordance to the guidelines. The following labs were ordered: CBC, comprehensive metabolic panel, fasting lipid profile, TSH, urinalysis and vitamin D level. She was provided with fecal Hemoccult cards to submit to the office for testing. She did receive the flu vaccine today.   Terrance Mass MD, 10:46 AM 03/31/2014

## 2014-03-31 NOTE — Patient Instructions (Addendum)
Densitometra sea  (Bone Densitometry) La densitometra sea es una radiografa especial que mide la densidad de los huesos y se [place] para predecir el riesgo de fracturas seas. Esta estudio se [place] para Actor contenido mineral y la densidad de los huesos para diagnosticar osteoporosis. La osteoporosis es la prdida de tejido seo que hace que el hueso se debilite. Generalmente ocurre en las mujeres que entran en la menopausia. Pero tambin pueden sufrirla los hombres y personas con otras enfermedades.  PREPARACIN PARA LA PRUEBA  No es necesaria la preparacin.  Linzie Collin EXAMINARSE?   Todas las [employer] de 11 aos.  Las mujeres posmenopusicas (74 a 44 aos) con factores de riesgo para osteoporosis.  Las personas que han sufrido fracturas previas realizando actividades normales.  Las personas de contextura corporal delgada (menos de 127 libras [63.5 kg] o con un ndice de masa corporal [IMC] de menos de 21).  Las personas que tengan un padre que haya sufrido una fractura de cadera o que tengan antecedentes de osteoporosis.  Los fumadores.  Las personas que sufren artritis reumatoidea.  Los que consumen alcohol en exceso (ms de [place]).  Las mujeres con menopausia temprana. CUNDO DEBE REALIZAR UN [place]?  Las guas actuales sugieren que se debe esperar por lo menos 2 aos antes de repetir una prueba de densidad sea, si la primera fue normal. Algunos estudios recientes indican que las mujeres con densidad sea normal pueden esperar unos aos antes de repetir un estudio de densitometra sea. Comente estos temas con su mdico.  HALLAZGOS NORMALES:   Normal: menos de una desviacin estndar por debajo de lo normal (superior a -1).  Osteopenia:  1 a 2,5 desviaciones estndar por debajo de lo normal (-1 a -2,5).  Osteoporosis: ms de 2,5 desviaciones estndar por debajo de lo normal (menos de -2,5). Los [employer] se  informan como una "puntuacin T" y Ardelia Mems "puntuacin Z". La puntuacin T es el nmero que compara la densidad sea con la densidad sea de las mujeres jvenes y sanas. La puntuacin Z es un nmero que compara la densidad sea con las puntuaciones de mujeres de la misma [place], gnero y International aid/development worker.  Los rangos para los resultados normales pueden variar entre diferentes laboratorios y hospitales. Consulte siempre con su mdico despus de Psychologist, counselling estudio para Personal assistant significado de los [place] y si los valores se consideran "dentro de los lmites normales".  SIGNIFICADO DEL ESTUDIO  El mdico leer los resultados y Teacher, English as a foreign language con usted la importancia y el significado de los [place], as como las opciones de tratamiento y la necesidad de pruebas adicionales, si fuera necesario.  OBTENCIN DE LOS RESULTADOS DE LAS PRUEBAS  Es su responsabilidad retirar el resultado del [place]. Consulte en el laboratorio cuando y cmo podr [employer].  Document Released: 01/11/2012 [facility] Patient Information 2015 [place]. This information is not intended to replace advice given to you by your health care provider. Make sure you discuss any questions you have with your health care provider. Influenza Virus Vaccine injection (Fluarix) Qu es este medicamento? La VACUNA ANTIGRIPAL ayuda a disminuir el riesgo de contraer la influenza, tambin conocida como la gripe. La vacuna solo ayuda a protegerle contra algunas cepas de influenza. Esta vacuna no ayuda a reducir Catering manager de contraer influenza pandmica H1N1. Este medicamento puede ser utilizado para otros usos; si tiene alguna pregunta consulte con su proveedor de atencin mdica o con su farmacutico. MARCAS COMERCIALES  DISPONIBLES: Fluarix, Fluzone Qu le debo informar a mi profesional de la salud antes de tomar este medicamento? Necesita saber si usted presenta alguno de los siguientes problemas o situaciones: -trastorno de sangrado como  hemofilia -fiebre o infeccin -sndrome de Guillain-Barre u otros problemas neurolgicos -problemas del sistema inmunolgico -infeccin por el virus de la inmunodeficiencia humana (VIH) o SIDA -niveles bajos de plaquetas en la sangre -esclerosis mltiple -una Risk analyst o inusual a las vacunas antigripales, a los huevos, protenas de pollo, al ltex, a la gentamicina, a otros medicamentos, alimentos, colorantes o conservantes -si est embarazada o buscando quedar embarazada -si est amamantando a un beb Cmo debo utilizar este medicamento? Esta vacuna se administra mediante inyeccin por va intramuscular. Lo administra un profesional de [employer]	[place]. Recibir una copia de informacin escrita sobre la vacuna antes de cada vacuna. Asegrese de leer este folleto cada vez cuidadosamente. Este folleto puede cambiar con frecuencia. Hable con su pediatra para informarse acerca del uso de este medicamento en nios. Puede requerir atencin especial. Sobredosis: Pngase en contacto inmediatamente con un centro toxicolgico o una sala de urgencia si usted cree que haya tomado demasiado medicamento. ATENCIN: [employer] es solo para usted. No comparta este medicamento con nadie. Qu sucede si me olvido de una dosis? No se aplica en este caso. Qu puede interactuar con este medicamento? -quimioterapia o radioterapia -medicamentos que suprimen el sistema inmunolgico, tales como etanercept, anakinra, infliximab y adalimumab -medicamentos que tratan o previenen cogulos sanguneos, como warfarina -fenitona -medicamentos esteroideos, como la prednisona o la cortisona -teofilina -vacunas Puede ser que esta lista no menciona todas las posibles interacciones. Informe a su profesional de [employer]	[place] de [employer] productos a base de hierbas, medicamentos de [place] o suplementos nutritivos que est tomando. Si usted fuma, consume bebidas alcohlicas o si utiliza drogas ilegales, indqueselo  tambin a su profesional de [employer]	[place]. Algunas sustancias pueden interactuar con su medicamento. A qu debo estar atento al usar [employer]? Informe a su mdico o a Barrister's clerk de la [employer] todos los efectos secundarios que persistan despus de 3 das. Llame a su proveedor de atencin mdica si se presentan sntomas inusuales dentro de las 6 semanas posteriores a la vacunacin. Es posible que todava pueda contraer la gripe, pero la enfermedad no ser tan fuerte como normalmente. No puede contraer la gripe de esta vacuna. La vacuna antigripal no le protege contra resfros u otras enfermedades que pueden causar [place]. Debe vacunarse cada ao. Qu efectos secundarios puedo tener al [employer] este medicamento? Efectos secundarios que debe informar a su mdico o a Barrister's clerk de la salud tan pronto como sea posible: -reacciones alrgicas como erupcin cutnea, picazn o urticarias, hinchazn de la cara, labios o lengua Efectos secundarios que, por lo general, no requieren atencin mdica (debe informarlos a su mdico o a su profesional de la salud si persisten o si son molestos): -fiebre -dolor de cabeza -molestias y dolores musculares -dolor, sensibilidad, enrojecimiento o Estate agent de la inyeccin -cansancio o debilidad Puede ser que esta lista no menciona todos los posibles efectos secundarios. Comunquese a su mdico por asesoramiento mdico [employer]. Usted puede informar los efectos secundarios a la FDA por telfono al 1-800-FDA-1088. Dnde debo guardar mi medicina? Esta vacuna se administra solamente en clnicas, farmacias, consultorio mdico u otro consultorio de un profesional de la salud y no Sports coach en su domicilio. ATENCIN: Este folleto es un resumen. Puede ser que no [place] toda la  posible informacin. Si usted tiene preguntas acerca de esta medicina, consulte con su mdico, su farmacutico o su profesional de Technical sales engineer.  2015,  Elsevier/Gold Standard. (2009-10-20 15:31:40)

## 2014-04-01 ENCOUNTER — Other Ambulatory Visit: Payer: Self-pay

## 2014-04-01 LAB — CYTOLOGY - PAP

## 2014-04-01 LAB — TSH: TSH: 1.2 u[IU]/mL (ref 0.350–4.500)

## 2014-04-01 LAB — VITAMIN D 25 HYDROXY (VIT D DEFICIENCY, FRACTURES): Vit D, 25-Hydroxy: 31 ng/mL (ref 30–100)

## 2014-04-03 LAB — URINE CULTURE
COLONY COUNT: NO GROWTH
Organism ID, Bacteria: NO GROWTH

## 2014-05-07 ENCOUNTER — Telehealth: Payer: Self-pay | Admitting: *Deleted

## 2014-05-07 NOTE — Telephone Encounter (Signed)
Pt called stating both Rx from [facility] 03/31/14 was never at pharmacy, both Rx was sent on print. Both Rx called in to pharmacy for xanxa 0.25mg  and Fioricet. Pt aware.

## 2014-09-26 ENCOUNTER — Other Ambulatory Visit: Payer: Self-pay

## 2014-09-26 DIAGNOSIS — Z1231 Encounter for screening mammogram for malignant neoplasm of breast: Secondary | ICD-10-CM

## 2014-10-22 ENCOUNTER — Ambulatory Visit
Admission: RE | Admit: 2014-10-22 | Discharge: 2014-10-22 | Disposition: A | Discharge status: Home / Self Care | Payer: BLUE CROSS/BLUE SHIELD | Source: Ambulatory Visit

## 2014-10-22 DIAGNOSIS — Z1231 Encounter for screening mammogram for malignant neoplasm of breast: Secondary | ICD-10-CM

## 2014-12-03 ENCOUNTER — Other Ambulatory Visit: Payer: Self-pay | Admitting: Gynecology

## 2014-12-03 NOTE — Telephone Encounter (Signed)
Called into pharmacy

## 2014-12-29 ENCOUNTER — Encounter: Payer: Self-pay | Admitting: Internal Medicine

## 2015-01-06 ENCOUNTER — Encounter: Payer: Self-pay | Admitting: Gastroenterology

## 2015-01-09 ENCOUNTER — Ambulatory Visit: Payer: BLUE CROSS/BLUE SHIELD | Admitting: Internal Medicine

## 2015-02-09 ENCOUNTER — Other Ambulatory Visit: Payer: Self-pay | Admitting: Gynecology

## 2015-02-09 NOTE — Telephone Encounter (Signed)
Called into pharmacy

## 2015-03-05 ENCOUNTER — Ambulatory Visit: Payer: BLUE CROSS/BLUE SHIELD | Admitting: Internal Medicine

## 2015-09-01 ENCOUNTER — Encounter: Payer: Self-pay | Admitting: Internal Medicine

## 2015-09-30 ENCOUNTER — Ambulatory Visit (INDEPENDENT_AMBULATORY_CARE_PROVIDER_SITE_OTHER): Payer: BLUE CROSS/BLUE SHIELD | Admitting: Gynecology

## 2015-09-30 ENCOUNTER — Encounter: Payer: Self-pay | Admitting: Gynecology

## 2015-09-30 VITALS — BP 120/86 | Ht 65.75 in | Wt 133.0 lb

## 2015-09-30 DIAGNOSIS — Z780 Asymptomatic menopausal state: Secondary | ICD-10-CM | POA: Diagnosis not present

## 2015-09-30 DIAGNOSIS — Z8639 Personal history of other endocrine, nutritional and metabolic disease: Secondary | ICD-10-CM | POA: Diagnosis not present

## 2015-09-30 DIAGNOSIS — Z01419 Encounter for gynecological examination (general) (routine) without abnormal findings: Secondary | ICD-10-CM | POA: Diagnosis not present

## 2015-09-30 LAB — CBC WITH DIFFERENTIAL/PLATELET
BASOS ABS: 0 {cells}/uL (ref 0–200)
BASOS PCT: 0 %
EOS ABS: 105 {cells}/uL (ref 15–500)
Eosinophils Relative: 3 %
HEMATOCRIT: 39.8 % (ref 35.0–45.0)
Hemoglobin: 13.1 g/dL (ref 11.7–15.5)
LYMPHS ABS: 1505 {cells}/uL (ref 850–3900)
LYMPHS PCT: 43 %
MCH: 31.5 pg (ref 27.0–33.0)
MCHC: 32.9 g/dL (ref 32.0–36.0)
MCV: 95.7 fL (ref 80.0–100.0)
MONOS PCT: 7 %
MPV: 9.5 fL (ref 7.5–12.5)
Monocytes Absolute: 245 cells/uL (ref 200–950)
NEUTROS ABS: 1645 {cells}/uL (ref 1500–7800)
Neutrophils Relative %: 47 %
PLATELETS: 241 10*3/uL (ref 140–400)
RBC: 4.16 MIL/uL (ref 3.80–5.10)
RDW: 13.2 % (ref 11.0–15.0)
WBC: 3.5 10*3/uL — AB (ref 3.8–10.8)

## 2015-09-30 LAB — COMPREHENSIVE METABOLIC PANEL
ALK PHOS: 81 U/L (ref 33–130)
ALT: 17 U/L (ref 6–29)
AST: 18 U/L (ref 10–35)
Albumin: 4.4 g/dL (ref 3.6–5.1)
BILIRUBIN TOTAL: 0.5 mg/dL (ref 0.2–1.2)
BUN: 9 mg/dL (ref 7–25)
CO2: 26 mmol/L (ref 20–31)
Calcium: 9.3 mg/dL (ref 8.6–10.4)
Chloride: 107 mmol/L (ref 98–110)
Creat: 0.6 mg/dL (ref 0.50–1.05)
GLUCOSE: 103 mg/dL — AB (ref 65–99)
Potassium: 4.5 mmol/L (ref 3.5–5.3)
Sodium: 142 mmol/L (ref 135–146)
TOTAL PROTEIN: 7.1 g/dL (ref 6.1–8.1)

## 2015-09-30 LAB — LIPID PANEL
Cholesterol: 199 mg/dL (ref 125–200)
HDL: 55 mg/dL (ref >=46)
LDL CALC: 123 mg/dL (ref <130)
TRIGLYCERIDES: 105 mg/dL (ref <150)
Total CHOL/HDL Ratio: 3.6 Ratio (ref <=5.0)
VLDL: 21 mg/dL (ref <30)

## 2015-09-30 LAB — TSH: TSH: 0.85 m[IU]/L

## 2015-09-30 MED ORDER — ALPRAZOLAM 0.25 MG PO TABS: 0.2500 mg | ORAL_TABLET | Freq: Every evening | ORAL | Status: DC | PRN

## 2015-09-30 MED ORDER — BUTALBITAL-APAP-CAFFEINE 50-325-40 MG PO TABS: 1.0000 | ORAL_TABLET | ORAL | Status: DC | PRN

## 2015-09-30 MED ORDER — ESTRADIOL 10 MCG VA TABS: 1.0000 | ORAL_TABLET | VAGINAL | Status: DC

## 2015-09-30 NOTE — Progress Notes (Signed)
Gail Miller 1960-12-22 [id number]   History:    55 y.o.  for annual gyn exam with complaining of tiredness and fatigue and also now she is experiencing headaches about 4 times per week.She is using Vagifem 10 g twice a week when she remembers for vaginal atrophy. Patient is having no vasomotor symptoms. Her [place] was elevated in 2013. Patient with  past history of Barrett's esophagitis and had a colonoscopy and EGD by Dr. Sharlett Iles in 2014 and has a follow-up EGD in the next couple weeks. Patient has been treated for vitamin D deficiency in the past. Patient has bilateral breast implants as well as has dense breasts for which we have been recommended three-dimensional mammograms yearly.Patient with no prior history of abnormal Pap smear. She is currently taking Os-Cal twice a day.  Past medical history,surgical history, family history and social history were all reviewed and documented in the EPIC chart.  Gynecologic History No LMP recorded. Patient is not currently having periods (Reason: Other). Contraception: post menopausal status Last Pap: 2012 2015. Results were: normal Last mammogram: 2016. Results were: normal  Obstetric History OB History  Gravida Para Term Preterm AB SAB TAB Ectopic Multiple Living  4 3 3  1 1    3     # Outcome Date GA Lbr Len/2nd Weight Sex Delivery Anes PTL Lv  4 SAB           3 Term     M Vag-Spont  N Y  2 Term     M Vag-Spont  N Y  1 Term     M Vag-Spont  N Y       ROS: A ROS was performed and pertinent positives and negatives are included in the history.  GENERAL: No fevers or chills. HEENT: No change in vision, no earache, sore throat or sinus congestion. NECK: No pain or stiffness. CARDIOVASCULAR: No chest pain or pressure. No palpitations. PULMONARY: No shortness of breath, cough or wheeze. GASTROINTESTINAL: No abdominal pain, nausea, vomiting or diarrhea, melena or bright red blood per rectum. GENITOURINARY: No urinary frequency, urgency, hesitancy  or dysuria. MUSCULOSKELETAL: No joint or muscle pain, no back pain, no recent trauma. DERMATOLOGIC: No rash, no itching, no lesions. ENDOCRINE: No polyuria, polydipsia, no heat or cold intolerance. No recent change in weight. HEMATOLOGICAL: No anemia or easy bruising or bleeding. NEUROLOGIC: No headache, seizures, numbness, tingling or weakness. PSYCHIATRIC: No depression, no loss of interest in normal activity or change in sleep pattern.     Exam: chaperone present  BP 120/86 mmHg  Ht 5' 5.75" (1.67 m)  Wt 133 lb (60.328 kg)  BMI 21.63 kg/m2  Body mass index is 21.63 kg/(m^2).  General appearance : Well developed well nourished female. No acute distress HEENT: Eyes: no retinal hemorrhage or exudates,  Neck supple, trachea midline, no carotid bruits, no thyroidmegaly Lungs: Clear to auscultation, no rhonchi or wheezes, or rib retractions  Heart: Regular rate and rhythm, no murmurs or gallops Breast:Examined in sitting and supine position were symmetrical in appearance, no palpable masses or tenderness,  no skin retraction, no nipple inversion, no nipple discharge, no skin discoloration, no axillary or supraclavicular lymphadenopathy Abdomen: no palpable masses or tenderness, no rebound or guarding Extremities: no edema or skin discoloration or tenderness  Pelvic:  Bartholin, Urethra, Skene Glands: Within normal limits             Vagina: No gross lesions or discharge  Cervix: No gross lesions or discharge  Uterus  anteverted, normal size, shape and consistency, non-tender and mobile  Adnexa  Without masses or tenderness  Anus and perineum  normal   Rectovaginal refused             Hemoccult stool cards provided     Assessment/Plan:  55 y.o. female for annual exam menopausal doing well on Vagifem 10 g intravaginally twice a week no vaginal bleeding reported. Because of patient's recent frequency and had a somewhat to refer her to a neurologist for further evaluation. Pap smear not  indicated this year. The following screening blood work was ordered today: Fasting lipid profile, comprehensive metabolic panel, TSH, CBC, and urinalysis. Because of her past history vitamin D deficiency we'll going to check a vitamin D level as well today. We discussed importance of calcium vitamin D and weightbearing exercises for osteoporosis prevention. Patient has not had a bone density study yet we are going to schedule one next year.  Terrance Mass MD, 12:16 PM 09/30/2015

## 2015-10-01 ENCOUNTER — Other Ambulatory Visit: Payer: Self-pay | Admitting: Anesthesiology

## 2015-10-01 DIAGNOSIS — E559 Vitamin D deficiency, unspecified: Secondary | ICD-10-CM

## 2015-10-01 LAB — URINALYSIS W MICROSCOPIC + REFLEX CULTURE
BILIRUBIN URINE: NEGATIVE
Bacteria, UA: NONE SEEN [HPF]
Casts: NONE SEEN [LPF]
GLUCOSE, UA: NEGATIVE
Hgb urine dipstick: NEGATIVE
NITRITE: NEGATIVE
PH: 5.5 (ref 5.0–8.0)
Protein, ur: NEGATIVE
SPECIFIC GRAVITY, URINE: 1.024 (ref 1.001–1.035)
Yeast: NONE SEEN [HPF]

## 2015-10-01 LAB — VITAMIN D 25 HYDROXY (VIT D DEFICIENCY, FRACTURES): Vit D, 25-Hydroxy: 27 ng/mL — ABNORMAL LOW (ref 30–100)

## 2015-10-01 MED ORDER — VITAMIN D (ERGOCALCIFEROL) 1.25 MG (50000 UNIT) PO CAPS: 50000.0000 [IU] | ORAL_CAPSULE | ORAL | Status: DC

## 2015-10-02 LAB — URINE CULTURE
Colony Count: NO GROWTH
Organism ID, Bacteria: NO GROWTH

## 2015-10-20 ENCOUNTER — Other Ambulatory Visit: Payer: Self-pay | Admitting: Anesthesiology

## 2015-10-20 DIAGNOSIS — Z1211 Encounter for screening for malignant neoplasm of colon: Secondary | ICD-10-CM

## 2015-12-16 ENCOUNTER — Other Ambulatory Visit: Payer: BLUE CROSS/BLUE SHIELD

## 2015-12-16 DIAGNOSIS — E559 Vitamin D deficiency, unspecified: Secondary | ICD-10-CM

## 2015-12-17 LAB — VITAMIN D 25 HYDROXY (VIT D DEFICIENCY, FRACTURES): Vit D, 25-Hydroxy: 51 ng/mL (ref 30–100)

## 2016-01-05 ENCOUNTER — Encounter: Payer: Self-pay | Admitting: Internal Medicine

## 2016-01-05 ENCOUNTER — Ambulatory Visit (INDEPENDENT_AMBULATORY_CARE_PROVIDER_SITE_OTHER): Payer: BLUE CROSS/BLUE SHIELD | Admitting: Internal Medicine

## 2016-01-05 VITALS — BP 102/68 | HR 80 | Ht 65.5 in | Wt 132.4 lb

## 2016-01-05 DIAGNOSIS — K2270 Barrett's esophagus without dysplasia: Secondary | ICD-10-CM

## 2016-01-05 DIAGNOSIS — K219 Gastro-esophageal reflux disease without esophagitis: Secondary | ICD-10-CM

## 2016-01-05 DIAGNOSIS — Z00129 Encounter for routine child health examination without abnormal findings: Secondary | ICD-10-CM

## 2016-01-05 NOTE — Patient Instructions (Signed)

## 2016-01-05 NOTE — Progress Notes (Signed)
     Assessment & Plan:   Encounter Diagnoses  Name Primary?  . Gastroesophageal reflux disease, esophagitis presence not specified Yes  . Barrett's esophagus without dysplasia   . Encounter for routine preventive care for patient older than 28 days    Might have short-segment barrett's - new criteria and absence of intestinal metaplasia on last EGD suggest not. She is concerned about PPI side effects and would rather not take. Optional tx in new guidelines.  We have decided to do an EGD and sort out status and determine Tx - she may not need any other than prn Maybe H2B     Subjective:  Cc: heartburn, hx of Barrett's  Patient ID: Gail Miller, female    DOB: 10-08-1960, 55 y.o.   MRN: [id number]  HPI The patient is here after [facility] a letter as she was due for a three-year recall on an EGD, set up by my retired partner Dr. Sharlett Iles, because of a history of Barrett's esophagus. 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 She describes 2 or 3 times a week several hours of heartburn symptoms. Greasy and spicy foods make this worse. Nexium became too expensive so she stopped it, she is not on any daily acid suppression at this time, she is concerned about the possibility of development of dementia after reading about that association with PPI therapy.  Son is a second year med student at [employer] -she is asking about shadowing opportunities with me for him. I told her to call back and I would be happy to do that if he was interested. Medications, allergies, past medical history, past surgical history, family history and social history are reviewed and updated in the EMR.  Review of Systems As above    Objective:   Physical Exam BP 102/68 (BP Location: Left Arm, Patient Position: Sitting, Cuff Size: Normal)   Pulse 80   Ht 5'  5.5" (1.664 m)   Wt 132 lb 6.4 oz (60.1 kg)   BMI 21.70 kg/m  Lungs cta Cor RRR S1S2 no rg Neuro: alert and oriented x 3  I appreciate the opportunity to care for this patient.

## 2016-01-13 ENCOUNTER — Ambulatory Visit (AMBULATORY_SURGERY_CENTER): Payer: BLUE CROSS/BLUE SHIELD | Admitting: Internal Medicine

## 2016-01-13 ENCOUNTER — Encounter: Payer: Self-pay | Admitting: Internal Medicine

## 2016-01-13 VITALS — BP 97/53 | HR 55 | Temp 98.6°F | Resp 13 | Ht 65.0 in | Wt 132.0 lb

## 2016-01-13 DIAGNOSIS — K2900 Acute gastritis without bleeding: Secondary | ICD-10-CM | POA: Diagnosis not present

## 2016-01-13 DIAGNOSIS — K2960 Other gastritis without bleeding: Secondary | ICD-10-CM

## 2016-01-13 DIAGNOSIS — Z00129 Encounter for routine child health examination without abnormal findings: Secondary | ICD-10-CM

## 2016-01-13 DIAGNOSIS — K3189 Other diseases of stomach and duodenum: Secondary | ICD-10-CM | POA: Diagnosis not present

## 2016-01-13 DIAGNOSIS — K2270 Barrett's esophagus without dysplasia: Secondary | ICD-10-CM

## 2016-01-13 MED ORDER — SODIUM CHLORIDE 0.9 % IV SOLN: 500.0000 mL | INTRAVENOUS | Status: DC

## 2016-01-13 NOTE — Progress Notes (Signed)
To PACU. Pt awake and alert. Report and alert.

## 2016-01-13 NOTE — Op Note (Signed)
[facility] Patient Name: Devita Hewatt Procedure Date: 01/13/2016 10:39 AM MRN: [id number] Endoscopist: Gatha Mayer , MD Age: 55 Referring MD:  Date of Birth: 12/14/1960 Gender: Female Account #: [id number] Procedure:                Upper GI endoscopy Indications:              Suspected Barrett's esophagus, Follow-up of                            Barrett's esophagus Medicines:                Propofol per Anesthesia, Monitored Anesthesia Care Procedure:                Pre-Anesthesia Assessment:                           - Prior to the procedure, a History and Physical                            was performed, and patient medications and                            allergies were reviewed. The patient's tolerance of                            previous anesthesia was also reviewed. The risks                            and benefits of the procedure and the sedation                            options and risks were discussed with the patient.                            All questions were answered, and informed consent                            was obtained. Prior Anticoagulants: The patient has                            taken no previous anticoagulant or antiplatelet                            agents. ASA Grade Assessment: II - A patient with                            mild systemic disease. After reviewing the risks                            and benefits, the patient was deemed in                            satisfactory condition to undergo the procedure.  After obtaining informed consent, the endoscope was                            passed under direct vision. Throughout the                            procedure, the patient's blood pressure, pulse, and                            oxygen saturations were monitored continuously. The                            Model GIF-[id number]) scope was introduced                            through the  mouth, and advanced to the second part                            of duodenum. The upper GI endoscopy was                            accomplished without difficulty. The patient                            tolerated the procedure well. Scope In: Scope Out: Findings:                 The examined esophagus was normal.                           The Z-line was variable.                           One non-bleeding cratered gastric ulcer with no                            stigmata of bleeding was found in the prepyloric                            region of the stomach. The lesion was 4 mm in                            largest dimension. Biopsies were taken with a cold                            forceps for histology. Verification of patient                            identification for the specimen was done. Estimated                            blood loss was minimal.                           Multiple dispersed, 2 to 3 mm  non-bleeding erosions                            were found in the prepyloric region of the stomach.                            There were no stigmata of recent bleeding. Biopsies                            were taken with a cold forceps for histology.                            Verification of patient identification for the                            specimen was done. Estimated blood loss was minimal.                           The exam was otherwise without abnormality.                           The cardia and gastric fundus were normal on                            retroflexion. Complications:            No immediate complications. Estimated Blood Loss:     Estimated blood loss was minimal. Impression:               - Normal esophagus.                           - Z-line variable. NO BARRETT"S - that dx was                            incorrect or it resolved                           - Non-bleeding gastric ulcer with no stigmata of                            bleeding.  Biopsied.                           - Non-bleeding erosive gastropathy. Biopsied.                           - The examination was otherwise normal. Recommendation:           - Patient has a contact number available for                            emergencies. The signs and symptoms of potential                            delayed complications were discussed with the  patient. Return to normal activities tomorrow.                            Written discharge instructions were provided to the                            patient.                           - Resume previous diet.                           - Continue present medications.                           - Await pathology results.                           - No repeat upper endoscopy. Gatha Mayer, MD 01/13/2016 11:06:56 AM This report has been signed electronically.

## 2016-01-13 NOTE — Patient Instructions (Addendum)
I do not see signs of Barrett's esophagus - that is either gone or was a misdiagnosis. I do not recommend follow-up exams for this.  You did have a small ulcer and erosions in the stomach - I took biopsies.  If you are taking aspirin, ibuprofen, naproxen or other similar medications that may be the cause. An infection called Helicobacter pylori could be causing this also.  I will let you know results and plans.  I appreciate the opportunity to care for you. Gatha Mayer, MD, FACG  YOU HAD AN ENDOSCOPIC PROCEDURE TODAY AT [facility]:   Refer to the procedure report that was given to you for any specific questions about what was found during the examination.  If the procedure report does not answer your questions, please call your gastroenterologist to clarify.  If you requested that your care partner not be given the details of your procedure findings, then the procedure report has been included in a sealed envelope for you to review at your convenience later.  YOU SHOULD EXPECT: Some feelings of bloating in the abdomen. Passage of more gas than usual.  Walking can help get rid of the air that was put into your GI tract during the procedure and reduce the bloating. If you had a lower endoscopy (such as a colonoscopy or flexible sigmoidoscopy) you may notice spotting of blood in your stool or on the toilet paper. If you underwent a bowel prep for your procedure, you may not have a normal bowel movement for a few days.  Please Note:  You might notice some irritation and congestion in your nose or some drainage.  This is from the oxygen used during your procedure.  There is no need for concern and it should clear up in a day or so.  SYMPTOMS TO REPORT IMMEDIATELY:   Following upper endoscopy (EGD)  Vomiting of blood or coffee ground material  New chest pain or pain under the shoulder blades  Painful or persistently difficult swallowing  New shortness of  breath  Fever of 100F or higher  Black, tarry-looking stools  For urgent or emergent issues, a gastroenterologist can be reached at any hour by calling [phone].   DIET:  We do recommend a small meal at first, but then you may proceed to your regular diet.  Drink plenty of fluids but you should avoid alcoholic beverages for 24 hours.  ACTIVITY:  You should plan to take it easy for the rest of today and you should NOT DRIVE or use heavy machinery until tomorrow (because of the sedation medicines used during the test).    FOLLOW UP: Our staff will call the number listed on your records the next business day following your procedure to check on you and address any questions or concerns that you may have regarding the information given to you following your procedure. If we do not reach you, we will leave a message.  However, if you are feeling well and you are not experiencing any problems, there is no need to return our call.  We will assume that you have returned to your regular daily activities without incident.  If any biopsies were taken you will be contacted by phone or by letter within the next 1-3 weeks.  Please call us at [phone] if you have not heard about the biopsies in 3 weeks.    SIGNATURES/CONFIDENTIALITY: You and/or your care partner have signed paperwork which will be entered into your electronic  medical record.  These signatures attest to the fact that that the information above on your After Visit Summary has been reviewed and is understood.  Full responsibility of the confidentiality of this discharge information lies with you and/or your care-partner.  Gastritis-handout given  Resume previous medications.

## 2016-01-13 NOTE — Progress Notes (Signed)
Called to room to assist during endoscopic procedure.  Patient ID and intended procedure confirmed with present staff. Received instructions for my participation in the procedure from the performing physician.  

## 2016-01-14 ENCOUNTER — Telehealth: Payer: Self-pay | Admitting: *Deleted

## 2016-01-14 NOTE — Telephone Encounter (Signed)
  Follow up Call-  Call back number 01/13/2016  Post procedure Call Back phone  #  [phone]  Permission to leave phone message Yes  Some recent data might be hidden     Patient questions:  Do you have a fever, pain , or abdominal swelling? No. Pain Score  0 *  Have you tolerated food without any problems? Yes.    Have you been able to return to your normal activities? Yes.    Do you have any questions about your discharge instructions: Diet   No. Medications  No. Follow up visit  No.  Do you have questions or concerns about your Care? No.  Actions: * If pain score is 4 or above: No action needed, pain <4.

## 2016-01-19 ENCOUNTER — Other Ambulatory Visit: Payer: Self-pay | Admitting: Gynecology

## 2016-01-19 NOTE — Progress Notes (Signed)
My Chart message sent to patient re: benign gastric erosions w/o H pylori PPI OTC x 1 month Watch using NSAID No recall

## 2016-01-20 NOTE — Telephone Encounter (Signed)
Called into pharmacy

## 2016-02-15 ENCOUNTER — Other Ambulatory Visit: Payer: Self-pay | Admitting: Gynecology

## 2016-02-22 ENCOUNTER — Other Ambulatory Visit: Payer: Self-pay | Admitting: Gynecology

## 2016-07-15 ENCOUNTER — Other Ambulatory Visit: Payer: Self-pay | Admitting: Gynecology

## 2016-07-15 DIAGNOSIS — Z1231 Encounter for screening mammogram for malignant neoplasm of breast: Secondary | ICD-10-CM

## 2016-08-05 ENCOUNTER — Ambulatory Visit
Admission: RE | Admit: 2016-08-05 | Discharge: 2016-08-05 | Disposition: A | Discharge status: Home / Self Care | Payer: BLUE CROSS/BLUE SHIELD | Source: Ambulatory Visit | Attending: Gynecology | Admitting: Gynecology

## 2016-08-05 DIAGNOSIS — Z1231 Encounter for screening mammogram for malignant neoplasm of breast: Secondary | ICD-10-CM

## 2016-09-07 ENCOUNTER — Encounter: Payer: BLUE CROSS/BLUE SHIELD | Admitting: Gynecology

## 2016-09-08 ENCOUNTER — Ambulatory Visit (INDEPENDENT_AMBULATORY_CARE_PROVIDER_SITE_OTHER): Payer: BLUE CROSS/BLUE SHIELD | Admitting: Gynecology

## 2016-09-08 ENCOUNTER — Encounter: Payer: Self-pay | Admitting: Gynecology

## 2016-09-08 VITALS — BP 102/60 | Ht 67.0 in | Wt 135.0 lb

## 2016-09-08 DIAGNOSIS — Z01419 Encounter for gynecological examination (general) (routine) without abnormal findings: Secondary | ICD-10-CM | POA: Diagnosis not present

## 2016-09-08 DIAGNOSIS — Z780 Asymptomatic menopausal state: Secondary | ICD-10-CM

## 2016-09-08 DIAGNOSIS — E559 Vitamin D deficiency, unspecified: Secondary | ICD-10-CM

## 2016-09-08 DIAGNOSIS — Z79890 Hormone replacement therapy (postmenopausal): Secondary | ICD-10-CM | POA: Diagnosis not present

## 2016-09-08 LAB — CBC WITH DIFFERENTIAL/PLATELET
BASOS ABS: 0 {cells}/uL (ref 0–200)
BASOS PCT: 0 %
EOS ABS: 295 {cells}/uL (ref 15–500)
Eosinophils Relative: 5 %
HEMATOCRIT: 37.6 % (ref 35.0–45.0)
Hemoglobin: 12.5 g/dL (ref 11.7–15.5)
LYMPHS PCT: 36 %
Lymphs Abs: 2124 cells/uL (ref 850–3900)
MCH: 32.6 pg (ref 27.0–33.0)
MCHC: 33.2 g/dL (ref 32.0–36.0)
MCV: 97.9 fL (ref 80.0–100.0)
MONO ABS: 413 {cells}/uL (ref 200–950)
MPV: 9 fL (ref 7.5–12.5)
Monocytes Relative: 7 %
NEUTROS PCT: 52 %
Neutro Abs: 3068 cells/uL (ref 1500–7800)
Platelets: 245 10*3/uL (ref 140–400)
RBC: 3.84 MIL/uL (ref 3.80–5.10)
RDW: 13.3 % (ref 11.0–15.0)
WBC: 5.9 10*3/uL (ref 3.8–10.8)

## 2016-09-08 LAB — COMPREHENSIVE METABOLIC PANEL
ALT: 17 U/L (ref 6–29)
AST: 16 U/L (ref 10–35)
Albumin: 4.1 g/dL (ref 3.6–5.1)
Alkaline Phosphatase: 75 U/L (ref 33–130)
BILIRUBIN TOTAL: 0.4 mg/dL (ref 0.2–1.2)
BUN: 8 mg/dL (ref 7–25)
CALCIUM: 9.1 mg/dL (ref 8.6–10.4)
CO2: 21 mmol/L (ref 20–31)
Chloride: 108 mmol/L (ref 98–110)
Creat: 0.65 mg/dL (ref 0.50–1.05)
Glucose, Bld: 85 mg/dL (ref 65–99)
POTASSIUM: 4.1 mmol/L (ref 3.5–5.3)
Sodium: 141 mmol/L (ref 135–146)
Total Protein: 6.7 g/dL (ref 6.1–8.1)

## 2016-09-08 LAB — LIPID PANEL
CHOLESTEROL: 197 mg/dL (ref <200)
HDL: 54 mg/dL (ref >50)
LDL Cholesterol: 112 mg/dL — ABNORMAL HIGH (ref <100)
TRIGLYCERIDES: 155 mg/dL — AB (ref <150)
Total CHOL/HDL Ratio: 3.6 Ratio (ref <5.0)
VLDL: 31 mg/dL — ABNORMAL HIGH (ref <30)

## 2016-09-08 MED ORDER — ESTRADIOL 10 MCG VA TABS: 1.0000 | ORAL_TABLET | VAGINAL | 11 refills | Status: DC

## 2016-09-08 NOTE — Addendum Note (Signed)
Addended by: Dorothyann Gibbs on: 09/08/2016 11:34 AM   Modules accepted: Orders

## 2016-09-08 NOTE — Progress Notes (Signed)
Gail Miller 1961/04/27 [id number]   History:    56 y.o.  for annual gyn exam with no complaints today. Patient is doing well on Vagifem 10 g tablet intravaginally for vaginal atrophy. She has no vasomotor symptoms. Patient with past history of Barrett's esophagitis and has had recent colonoscopy in 2014 and was instructed to follow-up in 10 years and her recent upper endoscopy was normal.Patient has been treated for vitamin D deficiency in the past. Patient has bilateral breast implants as well as has dense breasts for which we have been recommended three-dimensional mammograms yearly.Patient with no prior history of abnormal Pap smear. She is currently taking Os-Cal twice a day.  Past medical history,surgical history, family history and social history were all reviewed and documented in the EPIC chart.  Gynecologic History No LMP recorded. Patient is not currently having periods (Reason: Other). Contraception: post menopausal status Last Pap: 2015. Results were: normal Last mammogram: 2018. Results were: normal  Obstetric History OB History  Gravida Para Term Preterm AB Living  4 3 3   1 3   SAB TAB Ectopic Multiple Live Births  1       3    # Outcome Date GA Lbr Len/2nd Weight Sex Delivery Anes PTL Lv  4 SAB           3 Term     M Vag-Spont  N LIV  2 Term     M Vag-Spont  N LIV  1 Term     M Vag-Spont  N LIV       ROS: A ROS was performed and pertinent positives and negatives are included in the history.  GENERAL: No fevers or chills. HEENT: No change in vision, no earache, sore throat or sinus congestion. NECK: No pain or stiffness. CARDIOVASCULAR: No chest pain or pressure. No palpitations. PULMONARY: No shortness of breath, cough or wheeze. GASTROINTESTINAL: No abdominal pain, nausea, vomiting or diarrhea, melena or bright red blood per rectum. GENITOURINARY: No urinary frequency, urgency, hesitancy or dysuria. MUSCULOSKELETAL: No joint or muscle pain, no back pain, no recent  trauma. DERMATOLOGIC: No rash, no itching, no lesions. ENDOCRINE: No polyuria, polydipsia, no heat or cold intolerance. No recent change in weight. HEMATOLOGICAL: No anemia or easy bruising or bleeding. NEUROLOGIC: No headache, seizures, numbness, tingling or weakness. PSYCHIATRIC: No depression, no loss of interest in normal activity or change in sleep pattern.     Exam: chaperone present  BP 102/60   Ht 5\' 7"  (1.702 m)   Wt 135 lb (61.2 kg)   BMI 21.14 kg/m   Body mass index is 21.14 kg/m.  General appearance : Well developed well nourished female. No acute distress HEENT: Eyes: no retinal hemorrhage or exudates,  Neck supple, trachea midline, no carotid bruits, no thyroidmegaly Lungs: Clear to auscultation, no rhonchi or wheezes, or rib retractions  Heart: Regular rate and rhythm, no murmurs or gallops Breast:Examined in sitting and supine position were symmetrical in appearance, no palpable masses or tenderness,  no skin retraction, no nipple inversion, no nipple discharge, no skin discoloration, no axillary or supraclavicular lymphadenopathy Abdomen: no palpable masses or tenderness, no rebound or guarding Extremities: no edema or skin discoloration or tenderness  Pelvic:  Bartholin, Urethra, Skene Glands: Within normal limits             Vagina: No gross lesions or discharge, atrophic changes  Cervix: No gross lesions or discharge  Uterus  anteverted, normal size, shape and consistency, non-tender and mobile  Adnexa  Without masses or tenderness  Anus and perineum  normal   Rectovaginal  declined rectal exam             Hemoccult stool cards provided     Assessment/Plan:  56 y.o. female for annual exam who is menopausal with only symptoms of vaginal atrophy which she has responded with Vagifem 10 g tablets twice a week. She reports no vaginal bleeding. Patient with past history vitamin D deficiency for this reason a vitamin D level will be checked today along with her CBC,  fasting lipid profile and conference metabolic panel. Patient to schedule her bone density study. Pap smear was done today. We discussed importance of calcium vitamin D and weightbearing exercises for osteoporosis prevention. She was provided with fecal Hemoccult cards to submit to the office for testing.   Terrance Mass MD, 10:10 AM 09/08/2016

## 2016-09-08 NOTE — Patient Instructions (Signed)
Densitometra sea (Bone Densitometry) La densitometra sea es un estudio de diagnstico por imgenes en el que se utiliza una radiografa especial que mide la cantidad de calcio y otros minerales en los huesos (densidad sea). Este estudio tambin se conoce como examen de densidad mineral sea o radioabsorciometra de doble energa (DEXA). Puede medir la densidad sea en la cadera y la columna. Es similar a una radiografa comn. Tambin pueden hacerle este estudio para:  Diagnosticar una enfermedad que causa huesos dbiles o delgados (osteoporosis).  Predecir el riesgo de un hueso roto (fractura).  Determinar si el tratamiento para la osteoporosis funciona. INFORME A SU MDICO:  Cualquier alergia que tenga.  Todos los medicamentos que utiliza, incluidos vitaminas, hierbas, gotas oftlmicas, cremas y medicamentos de venta libre.  Problemas previos que usted o los miembros de su familia hayan tenido con el uso de anestsicos.  Enfermedades de la sangre que tenga.  Si tiene cirugas previas.  Enfermedades que tenga.  Probabilidad de embarazo.  Cualquier otro estudio mdico al que se haya sometido en los ltimos 14 das en el que se haya utilizado material de contraste. RIESGOS Y COMPLICACIONES En general, se trata de un procedimiento seguro. Sin embargo, pueden ocurrir algunos problemas, entre los que se pueden incluir los siguientes:  Este estudio lo expone a una cantidad muy pequea de radiacin.  Los riesgos de la exposicin a la radiacin pueden ser mayores para los nios por nacer. ANTES DEL PROCEDIMIENTO  No tome ningn suplemento de calcio durante 24 horas antes de realizarse el estudio. Puede comer y beber como lo hace habitualmente.  Qutese todas las joyas de metal, anteojos, aparatos dentales y cualquier otro objeto metlico. PROCEDIMIENTO  Deber recostarse en una camilla. Un generador de rayos X estar ubicado debajo de usted y un dispositivo de imgenes, por  encima.  Se pueden usar otros dispositivos, como cajas o abrazaderas, para posicionar el cuerpo apropiadamente para la exploracin.  Deber permanecer inmvil mientras la mquina explore lentamente su cuerpo.  Las imgenes se muestran en el monitor de una computadora. DESPUS DEL PROCEDIMIENTO Es posible que necesite estudios adicionales ms adelante. Esta informacin no tiene como fin reemplazar el consejo del mdico. Asegrese de hacerle al mdico cualquier pregunta que tenga. Document Released: 01/11/2012 Document Revised: 05/09/2014 Document Reviewed: 09/26/2013 Elsevier Interactive Patient Education  2017 [employer]  

## 2016-09-09 LAB — VITAMIN D 25 HYDROXY (VIT D DEFICIENCY, FRACTURES): Vit D, 25-Hydroxy: 33 ng/mL (ref 30–100)

## 2016-09-11 LAB — PAP IG W/ RFLX HPV ASCU

## 2016-09-14 ENCOUNTER — Other Ambulatory Visit: Payer: Self-pay | Admitting: Gynecology

## 2016-09-14 ENCOUNTER — Encounter: Payer: Self-pay | Admitting: Gynecology

## 2016-09-14 NOTE — Telephone Encounter (Signed)
Called into pharmacy

## 2017-03-27 ENCOUNTER — Telehealth: Payer: Self-pay | Admitting: *Deleted

## 2017-03-27 NOTE — Telephone Encounter (Signed)
Patient called requesting refill on Xanax 0.25 mg tablet and Fioricet 5-325-40 mg. Patient will be continuing care with you.  Please advise

## 2017-03-27 NOTE — Telephone Encounter (Signed)
Agree 

## 2017-03-28 MED ORDER — ALPRAZOLAM 0.25 MG PO TABS: ORAL_TABLET | ORAL | 0 refills | Status: DC

## 2017-03-28 MED ORDER — BUTALBITAL-APAP-CAFFEINE 50-325-40 MG PO TABS: ORAL_TABLET | ORAL | 0 refills | Status: DC

## 2017-03-28 NOTE — Telephone Encounter (Signed)
Both Rx called in today. I called [facility] and told them Rx was call in as a error and pt will pickup Rx at [place].Marland Kitchen Pt aware as well.

## 2017-03-29 ENCOUNTER — Other Ambulatory Visit: Payer: Self-pay

## 2017-11-27 ENCOUNTER — Other Ambulatory Visit: Payer: Self-pay | Admitting: Gynecology

## 2017-11-27 DIAGNOSIS — Z1231 Encounter for screening mammogram for malignant neoplasm of breast: Secondary | ICD-10-CM

## 2017-12-05 ENCOUNTER — Other Ambulatory Visit: Payer: Self-pay

## 2017-12-05 NOTE — Telephone Encounter (Signed)
Former patient of Dr. Durenda Guthrie. Last CE 09/08/16. She is scheduled with you for 01/15/18 for CE.  You did refill this for her this past November.

## 2017-12-06 MED ORDER — BUTALBITAL-APAP-CAFFEINE 50-325-40 MG PO TABS: ORAL_TABLET | ORAL | 1 refills | Status: DC

## 2017-12-25 ENCOUNTER — Ambulatory Visit
Admission: RE | Admit: 2017-12-25 | Discharge: 2017-12-25 | Disposition: A | Discharge status: Home / Self Care | Payer: BLUE CROSS/BLUE SHIELD | Source: Ambulatory Visit | Attending: Gynecology | Admitting: Gynecology

## 2017-12-25 DIAGNOSIS — Z1231 Encounter for screening mammogram for malignant neoplasm of breast: Secondary | ICD-10-CM

## 2018-01-15 ENCOUNTER — Ambulatory Visit (INDEPENDENT_AMBULATORY_CARE_PROVIDER_SITE_OTHER): Payer: BLUE CROSS/BLUE SHIELD | Admitting: Gynecology

## 2018-01-15 ENCOUNTER — Encounter: Payer: Self-pay | Admitting: Gynecology

## 2018-01-15 VITALS — BP 118/76 | Ht 66.5 in | Wt 121.0 lb

## 2018-01-15 DIAGNOSIS — Z13220 Encounter for screening for lipoid disorders: Secondary | ICD-10-CM

## 2018-01-15 DIAGNOSIS — Z01419 Encounter for gynecological examination (general) (routine) without abnormal findings: Secondary | ICD-10-CM

## 2018-01-15 DIAGNOSIS — E559 Vitamin D deficiency, unspecified: Secondary | ICD-10-CM

## 2018-01-15 DIAGNOSIS — N952 Postmenopausal atrophic vaginitis: Secondary | ICD-10-CM

## 2018-01-15 NOTE — Patient Instructions (Signed)
Follow-up in 1 year for annual exam, sooner if any issues. 

## 2018-01-15 NOTE — Progress Notes (Signed)
    Yanett Conkright 1961/03/09 [id number]        57 y.o.  [id number] for annual gynecologic exam.  Former patient of Dr. Toney Rakes.  Doing well without gynecologic complaints  Past medical history,surgical history, problem list, medications, allergies, family history and social history were all reviewed and documented as reviewed in the EPIC chart.  ROS:  Performed with pertinent positives and negatives included in the history, assessment and plan.   Additional significant findings : None   Exam: Caryn Bee assistant Vitals:   01/15/18 1403  BP: 118/76  Weight: 121 lb (54.9 kg)  Height: 5' 6.5" (1.689 m)   Body mass index is 19.24 kg/m.  General appearance:  Normal affect, orientation and appearance. Skin: Grossly normal HEENT: Without gross lesions.  No cervical or supraclavicular adenopathy. Thyroid normal.  Lungs:  Clear without wheezing, rales or rhonchi Cardiac: RR, without RMG Abdominal:  Soft, nontender, without masses, guarding, rebound, organomegaly or hernia Breasts:  Examined lying and sitting without masses, retractions, discharge or axillary adenopathy.  Bilateral implants noted Pelvic:  Ext, BUS, Vagina: With atrophic changes  Cervix: With atrophic changes  Uterus: Anteverted, normal size, shape and contour, midline and mobile nontender   Adnexa: Without masses or tenderness    Anus and perineum: Normal   Rectovaginal: Normal sphincter tone without palpated masses or tenderness.    Assessment/Plan:  57 y.o. [id number] female for annual gynecologic exam.   1. Postmenopausal/atrophic genital changes.  Has been on Vagifem but discontinued.  Overall she is doing well with some mild vaginal discomfort with intercourse.  We discussed options to include lubricants both water-based and oil-based.  Restarting vaginal estrogen discussed also.  At this point patient is going to use lubricants.  She will follow-up if this continues to be an issue and she wants to rediscuss vaginal  estrogen.  Not having significant overall menopausal symptoms. 2. Pap smear 2018.  No Pap smear done today.  No history of abnormal Pap smears.  Plan repeat Pap smear at 3-year interval per current screening guidelines. 3. Mammography 11/2017.  Continue with annual mammography when due.  Breast exam normal today. 4. Colonoscopy 2014.  Repeat at their recommended interval. 5. DEXA never.  Will plan further into the menopause. 6. Health maintenance.  Requests baseline labs.  CBC, CMP, lipid profile ordered.  We will also check vitamin D as she has a history of vitamin D deficiency.  Follow-up in 1 year, sooner as needed.   Anastasio Auerbach MD, 2:44 PM 01/15/2018

## 2018-01-16 ENCOUNTER — Other Ambulatory Visit: Payer: BLUE CROSS/BLUE SHIELD

## 2018-01-17 ENCOUNTER — Encounter: Payer: Self-pay | Admitting: Gynecology

## 2018-01-17 LAB — CBC WITH DIFFERENTIAL/PLATELET
BASOS ABS: 39 {cells}/uL (ref 0–200)
Basophils Relative: 0.8 %
EOS ABS: 191 {cells}/uL (ref 15–500)
Eosinophils Relative: 3.9 %
HCT: 36.8 % (ref 35.0–45.0)
HEMOGLOBIN: 12.5 g/dL (ref 11.7–15.5)
LYMPHS ABS: 2058 {cells}/uL (ref 850–3900)
MCH: 32.4 pg (ref 27.0–33.0)
MCHC: 34 g/dL (ref 32.0–36.0)
MCV: 95.3 fL (ref 80.0–100.0)
MONOS PCT: 5.5 %
MPV: 9.8 fL (ref 7.5–12.5)
NEUTROS PCT: 47.8 %
Neutro Abs: 2342 cells/uL (ref 1500–7800)
PLATELETS: 251 10*3/uL (ref 140–400)
RBC: 3.86 10*6/uL (ref 3.80–5.10)
RDW: 11.8 % (ref 11.0–15.0)
Total Lymphocyte: 42 %
WBC mixed population: 270 cells/uL (ref 200–950)
WBC: 4.9 10*3/uL (ref 3.8–10.8)

## 2018-01-17 LAB — LIPID PANEL
CHOL/HDL RATIO: 2.9 (calc) (ref <5.0)
Cholesterol: 198 mg/dL (ref <200)
HDL: 69 mg/dL (ref >50)
LDL CHOLESTEROL (CALC): 112 mg/dL — AB
NON-HDL CHOLESTEROL (CALC): 129 mg/dL (ref <130)
TRIGLYCERIDES: 79 mg/dL (ref <150)

## 2018-01-17 LAB — COMPREHENSIVE METABOLIC PANEL
AG Ratio: 2 (calc) (ref 1.0–2.5)
ALT: 19 U/L (ref 6–29)
AST: 17 U/L (ref 10–35)
Albumin: 4.3 g/dL (ref 3.6–5.1)
Alkaline phosphatase (APISO): 87 U/L (ref 33–130)
BUN: 9 mg/dL (ref 7–25)
CO2: 24 mmol/L (ref 20–32)
CREATININE: 0.62 mg/dL (ref 0.50–1.05)
Calcium: 9.2 mg/dL (ref 8.6–10.4)
Chloride: 107 mmol/L (ref 98–110)
Globulin: 2.1 g/dL (calc) (ref 1.9–3.7)
Glucose, Bld: 96 mg/dL (ref 65–99)
Potassium: 4.1 mmol/L (ref 3.5–5.3)
SODIUM: 141 mmol/L (ref 135–146)
TOTAL PROTEIN: 6.4 g/dL (ref 6.1–8.1)
Total Bilirubin: 0.4 mg/dL (ref 0.2–1.2)

## 2018-01-17 LAB — VITAMIN D 25 HYDROXY (VIT D DEFICIENCY, FRACTURES): Vit D, 25-Hydroxy: 30 ng/mL (ref 30–100)

## 2018-02-16 NOTE — Telephone Encounter (Signed)
Patient informed with my chart message. 

## 2018-08-08 ENCOUNTER — Telehealth: Payer: Self-pay | Admitting: *Deleted

## 2018-08-08 MED ORDER — BUTALBITAL-APAP-CAFFEINE 50-325-40 MG PO TABS: ORAL_TABLET | ORAL | 0 refills | Status: DC

## 2018-08-08 NOTE — Telephone Encounter (Signed)
Patient called requesting refill on Fioricet 5-350-40 mg tablet, last filled in 2019. Please advise

## 2018-08-08 NOTE — Telephone Encounter (Signed)
Okay for #30 with no refill

## 2018-08-08 NOTE — Telephone Encounter (Signed)
Rx called in. Patient aware.  

## 2018-10-24 ENCOUNTER — Emergency Department (HOSPITAL_COMMUNITY): Payer: BC Managed Care – PPO

## 2018-10-24 ENCOUNTER — Emergency Department (HOSPITAL_COMMUNITY)
Admission: EM | Admit: 2018-10-24 | Discharge: 2018-10-24 | Disposition: A | Discharge status: Home / Self Care | Payer: BC Managed Care – PPO | Attending: Emergency Medicine | Admitting: Emergency Medicine

## 2018-10-24 ENCOUNTER — Other Ambulatory Visit: Payer: Self-pay

## 2018-10-24 DIAGNOSIS — N132 Hydronephrosis with renal and ureteral calculous obstruction: Secondary | ICD-10-CM | POA: Diagnosis not present

## 2018-10-24 DIAGNOSIS — Z9851 Tubal ligation status: Secondary | ICD-10-CM | POA: Diagnosis not present

## 2018-10-24 DIAGNOSIS — R112 Nausea with vomiting, unspecified: Secondary | ICD-10-CM | POA: Diagnosis not present

## 2018-10-24 DIAGNOSIS — Z79899 Other long term (current) drug therapy: Secondary | ICD-10-CM | POA: Insufficient documentation

## 2018-10-24 DIAGNOSIS — N201 Calculus of ureter: Secondary | ICD-10-CM

## 2018-10-24 DIAGNOSIS — R109 Unspecified abdominal pain: Secondary | ICD-10-CM | POA: Diagnosis present

## 2018-10-24 DIAGNOSIS — N1330 Unspecified hydronephrosis: Secondary | ICD-10-CM

## 2018-10-24 LAB — URINALYSIS, ROUTINE W REFLEX MICROSCOPIC
Bilirubin Urine: NEGATIVE
Glucose, UA: NEGATIVE mg/dL
Ketones, ur: 5 mg/dL — AB
Leukocytes,Ua: NEGATIVE
Nitrite: NEGATIVE
Protein, ur: 30 mg/dL — AB
Specific Gravity, Urine: 1.024 (ref 1.005–1.030)
pH: 5 (ref 5.0–8.0)

## 2018-10-24 LAB — HEPATIC FUNCTION PANEL
ALT: 16 U/L (ref 0–44)
AST: 20 U/L (ref 15–41)
Albumin: 4.2 g/dL (ref 3.5–5.0)
Alkaline Phosphatase: 79 U/L (ref 38–126)
Bilirubin, Direct: 0.1 mg/dL (ref 0.0–0.2)
Indirect Bilirubin: 0.3 mg/dL (ref 0.3–0.9)
Total Bilirubin: 0.4 mg/dL (ref 0.3–1.2)
Total Protein: 6.5 g/dL (ref 6.5–8.1)

## 2018-10-24 LAB — BASIC METABOLIC PANEL
Anion gap: 11 (ref 5–15)
BUN: 13 mg/dL (ref 6–20)
CO2: 22 mmol/L (ref 22–32)
Calcium: 9.2 mg/dL (ref 8.9–10.3)
Chloride: 105 mmol/L (ref 98–111)
Creatinine, Ser: 0.72 mg/dL (ref 0.44–1.00)
GFR calc Af Amer: >60 mL/min (ref >60)
GFR calc non Af Amer: >60 mL/min (ref >60)
Glucose, Bld: 133 mg/dL — ABNORMAL HIGH (ref 70–99)
Potassium: 3.4 mmol/L — ABNORMAL LOW (ref 3.5–5.1)
Sodium: 138 mmol/L (ref 135–145)

## 2018-10-24 LAB — CBC
HCT: 38.5 % (ref 36.0–46.0)
Hemoglobin: 12.8 g/dL (ref 12.0–15.0)
MCH: 32.9 pg (ref 26.0–34.0)
MCHC: 33.2 g/dL (ref 30.0–36.0)
MCV: 99 fL (ref 80.0–100.0)
Platelets: 247 10*3/uL (ref 150–400)
RBC: 3.89 MIL/uL (ref 3.87–5.11)
RDW: 12.5 % (ref 11.5–15.5)
WBC: 8.3 10*3/uL (ref 4.0–10.5)
nRBC: 0 % (ref 0.0–0.2)

## 2018-10-24 LAB — LIPASE, BLOOD: Lipase: 35 U/L (ref 11–51)

## 2018-10-24 LAB — I-STAT BETA HCG BLOOD, ED (MC, WL, AP ONLY): I-stat hCG, quantitative: <5 m[IU]/mL (ref <5)

## 2018-10-24 MED ORDER — FENTANYL CITRATE (PF) 100 MCG/2ML IJ SOLN
75.0000 ug | Freq: Once | INTRAMUSCULAR | Status: AC
  Administered 2018-10-24: 75 ug via INTRAVENOUS
  Filled 2018-10-24: qty 2

## 2018-10-24 MED ORDER — OXYCODONE-ACETAMINOPHEN 5-325 MG PO TABS
1.0000 | ORAL_TABLET | Freq: Once | ORAL | Status: AC
  Administered 2018-10-24: 1 via ORAL
  Filled 2018-10-24: qty 1

## 2018-10-24 MED ORDER — ONDANSETRON 4 MG PO TBDP: 4.0000 mg | ORAL_TABLET | Freq: Three times a day (TID) | ORAL | 0 refills | Status: DC | PRN

## 2018-10-24 MED ORDER — TAMSULOSIN HCL 0.4 MG PO CAPS: 0.4000 mg | ORAL_CAPSULE | Freq: Every day | ORAL | 0 refills | Status: AC

## 2018-10-24 MED ORDER — KETOROLAC TROMETHAMINE 30 MG/ML IJ SOLN
30.0000 mg | Freq: Once | INTRAMUSCULAR | Status: AC
  Administered 2018-10-24: 30 mg via INTRAVENOUS
  Filled 2018-10-24: qty 1

## 2018-10-24 MED ORDER — OXYCODONE HCL 5 MG PO TABS: 5.0000 mg | ORAL_TABLET | ORAL | 0 refills | Status: AC | PRN

## 2018-10-24 MED ORDER — ONDANSETRON HCL 4 MG/2ML IJ SOLN
4.0000 mg | Freq: Once | INTRAMUSCULAR | Status: AC
  Administered 2018-10-24: 4 mg via INTRAVENOUS
  Filled 2018-10-24: qty 2

## 2018-10-24 NOTE — ED Provider Notes (Signed)
[facility] EMERGENCY DEPARTMENT Provider Note   CSN: [id number] Arrival date & time: 10/24/18  1608    History   Chief Complaint Chief Complaint  Patient presents with   Flank Pain    HPI Gail Miller is a 58 y.o. female with history of renal stones, bilateral tubal ligation is here for evaluation of flank pain.  Onset around 2 PM.  Located to the right flank, radiating to the right mid abdomen and right lower quadrant.  Described as "muscle spasm".  The pain was initially severe but it has since improved significantly.  The pain is intermittent, colicky.  She has had associated nausea, vomiting, difficulty urinating described as "uncomfortable".  She has tried drinking a lot of water without relief.  She was given Percocet prior to my exam and her symptoms have slightly improved.  She has history of kidney stone 30 years ago that she passed without surgical intervention.  She denies any fevers, chills, chest pain, shortness of breath, dysuria, hematuria, changes to bowel movements, abnormal vaginal bleeding.  LMP 3 years ago.  No distal paresthesias, weakness, loss of sensation.  H/o BTL but no other previous abdominal surgeries.     HPI  Past Medical History:  Diagnosis Date   Anxiety    Barrett's esophagus    Vitamin D deficiency     Patient Active Problem List   Diagnosis Date Noted   Vaginismus 04/04/2012   Osteopenia 03/22/2012   Menopause 03/22/2011   Vitamin D deficiency 63/78/5885   HELICOBACTER PYLORI INFECTION 08/14/2007   ANEMIA, CHRONIC 08/14/2007   ESOPHAGEAL REFLUX 08/14/2007   BARRETTS ESOPHAGUS 08/14/2007   POLYP, GALLBLADDER 08/14/2007   NEPHROLITHIASIS, HX OF 08/14/2007   MIGRAINES, HX OF 08/14/2007    Past Surgical History:  Procedure Laterality Date   AUGMENTATION MAMMAPLASTY     SILICON   COLONOSCOPY     ENDOMETRIAL ABLATION  07/25/2007   HER OPTION   ESOPHAGOGASTRODUODENOSCOPY     left index finger  tendonitis surg     TUBAL LIGATION     btsp     OB History    Gravida  4   Para  3   Term  3   Preterm      AB  1   Living  3     SAB  1   TAB      Ectopic      Multiple      Live Births  3            Home Medications    Prior to Admission medications   Medication Sig Start Date End Date Taking? Authorizing Provider  ALPRAZolam (XANAX) 0.25 MG tablet TAKE ONE TABLET BY MOUTH AT BEDTIME AS NEEDED FOR ANXIETY 03/28/17   Princess Bruins, MD  butalbital-acetaminophen-caffeine (FIORICET, ESGIC) 50-325-40 MG tablet TAKE ONE TABLET BY MOUTH EVERY 4 HOURS AS NEEDED FOR HEADACHE 08/08/18   Fontaine, Belinda Block, MD  calcium carbonate (OS-CAL) 600 MG TABS Take 600 mg by mouth 2 (two) times daily with a meal.      [provider]  Estradiol 10 MCG TABS vaginal tablet Place 1 tablet (10 mcg total) vaginally 2 (two) times a week. Patient not taking: Reported on 01/15/2018 09/08/16   Terrance Mass, MD  fish oil-omega-3 fatty acids 1000 MG capsule Take 2 g by mouth daily.      [provider]  ondansetron (ZOFRAN ODT) 4 MG disintegrating tablet Take 1 tablet (4 mg total)  by mouth every 8 (eight) hours as needed for nausea or vomiting. 10/24/18   Kinnie Feil, PA-C  oxyCODONE (OXY IR/ROXICODONE) 5 MG immediate release tablet Take 1 tablet (5 mg total) by mouth every 4 (four) hours as needed for up to 3 days for severe pain. 10/24/18 10/27/18  Kinnie Feil, PA-C  tamsulosin (FLOMAX) 0.4 MG CAPS capsule Take 1 capsule (0.4 mg total) by mouth daily after breakfast for 15 days. 10/24/18 11/08/18  Kinnie Feil, PA-C    Family History Family History  Problem Relation Age of Onset   Osteoporosis Mother    Arthritis Mother    Cirrhosis Maternal Grandmother    Colon cancer Neg Hx    Stomach cancer Neg Hx    Breast cancer Neg Hx     Social History Social History   Tobacco Use   Smoking status: Never Smoker   Smokeless tobacco: Never Used   Substance Use Topics   Alcohol use: No    Alcohol/week: 0.0 standard drinks   Drug use: No     Allergies   Patient has no known allergies.   Review of Systems Review of Systems  Gastrointestinal: Positive for nausea and vomiting.  Genitourinary: Positive for difficulty urinating and flank pain.  All other systems reviewed and are negative.    Physical Exam Updated Vital Signs BP 101/77 (BP Location: Right Arm)    Pulse 81    Temp 97.9 F (36.6 C) (Oral)    Resp (!) 22    LMP 02/19/2012    SpO2 100%   Physical Exam Vitals signs and nursing note reviewed.  Constitutional:      Appearance: She is well-developed.     Comments: Non toxic in NAD  HENT:     Head: Normocephalic and atraumatic.     Nose: Nose normal.  Eyes:     Conjunctiva/sclera: Conjunctivae normal.  Neck:     Musculoskeletal: Normal range of motion.  Cardiovascular:     Rate and Rhythm: Normal rate and regular rhythm.     Comments: 1+ radial and DP pulses bilaterally  Pulmonary:     Effort: Pulmonary effort is normal.     Breath sounds: Normal breath sounds.  Abdominal:     General: Bowel sounds are normal.     Palpations: Abdomen is soft.     Tenderness: There is abdominal tenderness. There is right CVA tenderness.     Comments: Mild right lateral CVAT. Mild right mid abdominal tenderness. No G/R/R. No suprapubic tenderness. Negative Murphy's and McBurney's. Active BS to lower quadrants. No pulsatility.   Musculoskeletal: Normal range of motion.  Skin:    General: Skin is warm and dry.     Capillary Refill: Capillary refill takes less than 2 seconds.  Neurological:     Mental Status: She is alert.     Comments: Sensation and strength intact in upper/lower extremities bilaterally   Psychiatric:        Behavior: Behavior normal.      ED Treatments / Results  Labs (all labs ordered are listed, but only abnormal results are displayed) Labs Reviewed  URINALYSIS, ROUTINE W REFLEX MICROSCOPIC -  Abnormal; Notable for the following components:      Result Value   APPearance HAZY (*)    Hgb urine dipstick LARGE (*)    Ketones, ur 5 (*)    Protein, ur 30 (*)    Bacteria, UA RARE (*)    All other components within normal limits  BASIC METABOLIC PANEL - Abnormal; Notable for the following components:   Potassium 3.4 (*)    Glucose, Bld 133 (*)    All other components within normal limits  CBC  HEPATIC FUNCTION PANEL  LIPASE, BLOOD  I-STAT BETA HCG BLOOD, ED ([facility], [facility], [facility] ONLY)    EKG None  Radiology Ct Renal Stone Study  Result Date: 10/24/2018 CLINICAL DATA:  Right-sided pain with history of renal stones. EXAM: CT ABDOMEN AND PELVIS WITHOUT CONTRAST TECHNIQUE: Multidetector CT imaging of the abdomen and pelvis was performed following the standard protocol without IV contrast. COMPARISON:  None. FINDINGS: LOWER CHEST: There is no basilar pleural or apical pericardial effusion. HEPATOBILIARY: The hepatic contours and density are normal. There is no intra- or extrahepatic biliary dilatation. The gallbladder is normal. PANCREAS: The pancreatic parenchymal contours are normal and there is no ductal dilatation. There is no peripancreatic fluid collection. SPLEEN: Normal. ADRENALS/URINARY TRACT: --Adrenal glands: Normal. --Right kidney/ureter: There is a stone within the distal ureter measuring 4 x 3 mm, causing mild hydroureteronephrosis and minimal perinephric stranding. --Left kidney/ureter: No hydronephrosis, nephroureterolithiasis, perinephric stranding or solid renal mass. --Urinary bladder: Normal for degree of distention STOMACH/BOWEL: --Stomach/Duodenum: There is no hiatal hernia or other gastric abnormality. The duodenal course and caliber are normal. --Small bowel: No dilatation or inflammation. --Colon: No focal abnormality. --Appendix: Normal. VASCULAR/LYMPHATIC: Normal course and caliber of the major abdominal vessels. No abdominal or pelvic lymphadenopathy. REPRODUCTIVE: Normal  uterus and ovaries. MUSCULOSKELETAL. L4 limbus vertebrae incidentally noted. OTHER: None. IMPRESSION: Right obstructive uropathy with 4 x 3 mm stone at the ureterovesical junction causing mild hydroureteronephrosis. No other nephroureterolithiasis. Electronically Signed   By: Ulyses Jarred M.D.   On: 10/24/2018 19:08    Procedures Procedures (including critical care time)  Medications Ordered in ED Medications  oxyCODONE-acetaminophen (PERCOCET/ROXICET) 5-325 MG per tablet 1 tablet (1 tablet Oral Given 10/24/18 1643)  ketorolac (TORADOL) 30 MG/ML injection 30 mg (30 mg Intravenous Given 10/24/18 1928)  fentaNYL (SUBLIMAZE) injection 75 mcg (75 mcg Intravenous Given 10/24/18 1930)  ondansetron (ZOFRAN) injection 4 mg (4 mg Intravenous Given 10/24/18 1925)     Initial Impression / Assessment and Plan / ED Course  I have reviewed the triage vital signs and the nursing notes.  Pertinent labs & imaging results that were available during my care of the patient were reviewed by me and considered in my medical decision making (see chart for details).  Clinical Course as of Oct 24 2019  Wed Oct 24, 2018  1750 Hgb urine dipstick(!): LARGE [CG]  1750 Protein(!): 30 [CG]  1750 RBC / HPF: 11-20 [CG]  1750 Bacteria, UA(!): RARE [CG]  1954 --Right kidney/ureter: There is a stone within the distal ureter measuring 4 x 3 mm, causing mild hydroureteronephrosis and minimal perinephric stranding.  CT Renal Laren Everts [CG]  3662 Patient re-evaluated. Pain has been adequately controlled. Repeat abd exam improved. Discussed CT findings and plan to dc. Questions answered.    [CG]    Clinical Course User Index [CG] Kinnie Feil, PA-C   57 year old here with right flank pain.  History of renal stones in the past.  No fevers.  Producing urine.  No associated chest pain, shortness of breath, distal paresthesias or weakness.  No trauma.  No cauda equina symptoms.  Exam reveals mild right CVA and right mid  abdominal tenderness.  Neuro/pulse deficits.  No peritonitis.  No pulsatile masses in the abdomen.  DDX includes UTI versus pyelonephritis versus renal stone.  She has no right upper quadrant tenderness, Murphy sign and biliary colic/cholecystitis is less likely.  I reviewed patient's EMR to obtain pertinent medical history.  ER work-up reviewed and interpreted by me, remarkable as above.  RBCs in urine, large Hgb but no signs of infection.  CT renal shows right distal ureter small stone with mild hydronephrosis.  Patient reevaluated in the ER.  Repeat abdominal exam improved.  Nausea and pain adequately controlled.  We discussed her CT findings.  She will be discharged with Flomax, Zofran, high-dose NSAIDs and oxycodone for breakthrough pain.  Recommended urology follow-up in 7 to 10 days for persistent pain.  She is aware symptoms or return to the ER.  She is comfortable with ER POC and discharge plan.  Final Clinical Impressions(s) / ED Diagnoses   Final diagnoses:  Right ureteral calculus  Hydronephrosis of right kidney    ED Discharge Orders         Ordered    tamsulosin (FLOMAX) 0.4 MG CAPS capsule  Daily after breakfast     10/24/18 2007    oxyCODONE (OXY IR/ROXICODONE) 5 MG immediate release tablet  Every 4 hours PRN     10/24/18 2007    ondansetron (ZOFRAN ODT) 4 MG disintegrating tablet  Every 8 hours PRN     10/24/18 2007           Kinnie Feil, Hershal Coria 10/24/18 2021    Fredia Sorrow, MD 10/28/18 1715

## 2018-10-24 NOTE — ED Notes (Signed)
Patient verbalizes understanding of discharge instructions. Opportunity for questioning and answers were provided. Armband removed by staff, pt discharged from ED ambulatory.   

## 2018-10-24 NOTE — Discharge Instructions (Addendum)
You have a calculus in your right ureter.  There is some swelling in the right kidney.  Take tamsulosin (flomax) to help dilate the ureter and help the calculus pass.  Take ondansetron (zofran) for nausea.  For pain and inflammation you can use a combination of ibuprofen and acetaminophen.  Take [phone] mg acetaminophen (tylenol) every 6 hours or 600 mg ibuprofen (advil, motrin) every 6 hours.  You can take these separately or combine them every 6 hours for maximum pain control. Do not exceed 4,000 mg acetaminophen or 2,400 mg ibuprofen in a 24 hour period.  Do not take ibuprofen containing products if you are pregnant or you have history of kidney disease, ulcers, GI bleeding, severe acid reflux, or take a blood thinner.  Do not take acetaminophen if you have liver disease.  For break through and/or severe pain despite ibuprofen and acetaminophen regimen, take 5 mg oxycodone every 4 hours.  Oxycodone is a narcotic pain medication that has risk of overdose, death, dependence and abuse. Mild and expected side effects include nausea, stomach upset, drowsiness, constipation. Do not consume alcohol, drive or use heavy machinery while taking this medication. Do not leave unattended around children. Flush any remaining pills that you do not use and do not share.  The emergency department has a strict policy regarding prescription of narcotic medications. We prescribe a short course for acute, new pain or injuries. We are unable to refill this medication in the emergency department for chronic pain or repeatedly.  Refill need to be done by specialist or primary care provider or pain clinic.  Contact your primary care provider or specialist for chronic pain management and refill on narcotic medications.   You need to follow up with urology in 7-10 days if pain persists. Return to the ER for fever (100.4 F or greater), vomiting, difficulty urinating, worsening pain despite medicines.  Tiene un clculo en su urter  derecho. Tiene level hinchazn en el rin derecho por el clculo   Tome tamsulosina (flomax) para ayudar a Psychiatric nurse urter y ayudar a que pase el clculo. Tome ondansetron (zofran) para las nuseas. Para el dolor y la inflamacin, puede usar una combinacin de ibuprofeno y acetaminofeno. Tome [phone] mg de acetaminofeno (tylenol) cada 6 horas o 600 mg de ibuprofeno (advil, motrin) cada 6 horas. Puede tomarlos por separado o combinarlos cada 6 horas para un control mximo del dolor. No exceda los 4,000 mg de acetaminofeno o 2,400 mg de ibuprofeno en un perodo de 24 horas. No tome productos que contengan ibuprofeno si est embarazada o tiene antecedentes de enfermedad renal, lceras, sangrado gastrointestinal, reflujo cido severo o toma un anticoagulante. No tome acetaminofn si tiene una enfermedad heptica. Para el avance y / o dolor intenso a pesar del rgimen de ibuprofeno y acetaminofeno, tome 5 mg de oxicodona cada 4 horas. La oxicodona es un medicamento narctico para el dolor que tiene riesgo de sobredosis, [place], dependencia y abuso. Los efectos secundarios leves y esperados incluyen nuseas, malestar estomacal, somnolencia, estreimiento. No consuma alcohol, conduzca ni use maquinaria pesada mientras est tomando [employer]. No lo deje desatendido cerca de los nios. Elimine las pldoras restantes que no [place] y no comparte. El departamento de emergencias tiene una poltica estricta con respecto a la prescripcin de medicamentos narcticos. Prescribimos un curso corto para el dolor agudo, nuevo o lesiones. No podemos rellenar este medicamento en el departamento de emergencias por dolor crnico o repetidamente. La recarga debe ser realizada por un especialista o proveedor  de atencin primaria o clnica de dolor. Pngase en contacto con su proveedor de atencin primaria o especialista para el tratamiento del dolor crnico y la recarga de medicamentos narcticos.  Debe hacer seguimiento con  Development worker, community en 7-10 das si el dolor persiste. Regrese a la sala de emergencias por fiebre (100.4 F o ms), vmitos, dificultad para orinar, empeoramiento del dolor a pesar de los medicamentos.

## 2018-10-24 NOTE — ED Notes (Signed)
ED Provider at bedside. 

## 2018-10-24 NOTE — ED Triage Notes (Signed)
Pt here with c/o right side flank pain that started today , history of kidney stones

## 2019-01-18 ENCOUNTER — Other Ambulatory Visit: Payer: Self-pay | Admitting: Gynecology

## 2019-01-29 ENCOUNTER — Encounter: Payer: Self-pay | Admitting: Gynecology

## 2019-03-01 ENCOUNTER — Other Ambulatory Visit: Payer: Self-pay | Admitting: Gynecology

## 2019-03-01 DIAGNOSIS — Z1231 Encounter for screening mammogram for malignant neoplasm of breast: Secondary | ICD-10-CM

## 2019-03-31 ENCOUNTER — Other Ambulatory Visit: Payer: Self-pay | Admitting: Gynecology

## 2019-04-01 ENCOUNTER — Other Ambulatory Visit: Payer: Self-pay | Admitting: Gynecology

## 2019-04-01 NOTE — Telephone Encounter (Signed)
Called into pharmacy

## 2019-04-01 NOTE — Telephone Encounter (Signed)
Past due for annual exam. Was due in Sept.

## 2019-04-22 ENCOUNTER — Ambulatory Visit
Admission: RE | Admit: 2019-04-22 | Discharge: 2019-04-22 | Disposition: A | Discharge status: Home / Self Care | Payer: BC Managed Care – PPO | Source: Ambulatory Visit | Attending: Gynecology | Admitting: Gynecology

## 2019-04-22 ENCOUNTER — Other Ambulatory Visit: Payer: Self-pay

## 2019-04-22 DIAGNOSIS — Z1231 Encounter for screening mammogram for malignant neoplasm of breast: Secondary | ICD-10-CM

## 2019-07-16 ENCOUNTER — Other Ambulatory Visit: Payer: Self-pay

## 2020-02-03 ENCOUNTER — Other Ambulatory Visit: Payer: Self-pay | Admitting: Family Medicine

## 2020-02-03 DIAGNOSIS — Z1231 Encounter for screening mammogram for malignant neoplasm of breast: Secondary | ICD-10-CM

## 2020-02-13 ENCOUNTER — Encounter: Payer: Self-pay | Admitting: Plastic Surgery

## 2020-02-13 ENCOUNTER — Ambulatory Visit (INDEPENDENT_AMBULATORY_CARE_PROVIDER_SITE_OTHER): Payer: Managed Care, Other (non HMO) | Admitting: Plastic Surgery

## 2020-02-13 ENCOUNTER — Other Ambulatory Visit: Payer: Self-pay

## 2020-02-13 VITALS — BP 116/72 | HR 81 | Temp 99.0°F | Ht 67.0 in | Wt 122.0 lb

## 2020-02-13 DIAGNOSIS — L989 Disorder of the skin and subcutaneous tissue, unspecified: Secondary | ICD-10-CM | POA: Diagnosis not present

## 2020-02-13 NOTE — Progress Notes (Signed)
Referring Provider [name], MD [address] [place],  [place] [address]   CC:  Chief Complaint  Patient presents with  . Advice Only      Gail Miller is an 59 y.o. female.  HPI: Patient presents with a cystic lesion of the right cheek.  This was treated several years ago with an excision however it seemed to recur shortly after.  She intermittently gets drainage from the prior scar and adjacent to it.  She has a period of firmness that is intermittently tender.  It drains all the time and is fairly foul-smelling.  She would like to see if anything else can be done.  She also has a few pigmented lesions medial to this that have been present for a while that bother her as well.  No Known Allergies  Outpatient Encounter Medications as of 02/13/2020  Medication Sig  . ALPRAZolam (XANAX) 0.25 MG tablet TAKE ONE TABLET BY MOUTH AT BEDTIME AS NEEDED FOR ANXIETY  . butalbital-acetaminophen-caffeine (FIORICET) 50-325-40 MG tablet TAKE 1 TABLET BY MOUTH EVERY 4 HOURS AS NEEDED FOR HEADACHE  . calcium carbonate (OS-CAL) 600 MG TABS Take 600 mg by mouth 2 (two) times daily with a meal.    . fish oil-omega-3 fatty acids 1000 MG capsule Take 2 g by mouth daily.    . [DISCONTINUED] Estradiol 10 MCG TABS vaginal tablet Place 1 tablet (10 mcg total) vaginally 2 (two) times a week. (Patient not taking: Reported on 01/15/2018)  . [DISCONTINUED] ondansetron (ZOFRAN ODT) 4 MG disintegrating tablet Take 1 tablet (4 mg total) by mouth every 8 (eight) hours as needed for nausea or vomiting.   No facility-administered encounter medications on file as of 02/13/2020.     Past Medical History:  Diagnosis Date  . Anxiety   . Barrett's esophagus   . Vitamin D deficiency     Past Surgical History:  Procedure Laterality Date  . AUGMENTATION MAMMAPLASTY Bilateral    SILICON  . COLONOSCOPY    . ENDOMETRIAL ABLATION  07/25/2007   HER OPTION  . ESOPHAGOGASTRODUODENOSCOPY    . left index finger  tendonitis surg    . TUBAL LIGATION     btsp    Family History  Problem Relation Age of Onset  . Osteoporosis Mother   . Arthritis Mother   . Cirrhosis Maternal Grandmother   . Colon cancer Neg Hx   . Stomach cancer Neg Hx   . Breast cancer Neg Hx     Social History   Social History Narrative   Married   1 son is a Careers information officer at [employer]      01/05/2016   Updated last     Review of Systems General: Denies fevers, chills, weight loss CV: Denies chest pain, shortness of breath, palpitations  Physical Exam Vitals with BMI 02/13/2020 10/24/2018 10/24/2018  Height 5\' 7"  - -  Weight 122 lbs - -  BMI 72.0 - -  Systolic [phone]  Diastolic 72 66 77  Pulse 81 56 81    General:  No acute distress,  Alert and oriented, Non-Toxic, Normal speech and affect Examination shows a 1.5 to 2 cm vertical scar in the right zygomatic area.  There is some surrounding firmness.  When I press on this some able to express some clear drainage.  Medially towards the nose cheek junction there is several scattered 2 to 3 mm diameter pigmented lesions that are slightly raised.  These have a benign appearance and  well demarcated border.  Assessment/Plan Patient presents with a recurrent cystic lesion in the right cheek.  We discussed excision.  We discussed the risks include bleeding, infection, damage to surrounding structures and need for additional procedures.  I discussed that her scar would be a bit longer than her previous 1.  I specifically pointed out that the area is a bit ill-defined in terms of the cystic lesion however I felt I could encompass that and that reasonable excision.  I did explain and drew out for her the area that I would excise in the orientation and the expectation for where the scar would be.  We did discuss the potential for cyst recurrence even despite all these efforts.  All her questions were answered and we will plan to proceed with doing this under local in the  office.  I can also shave off 1 or 2 of the pigmented lesions that bother her at the same time.  Cindra Presume 02/13/2020, 4:50 PM

## 2020-03-02 ENCOUNTER — Ambulatory Visit
Admission: RE | Admit: 2020-03-02 | Discharge: 2020-03-02 | Disposition: A | Discharge status: Home / Self Care | Payer: Managed Care, Other (non HMO) | Source: Ambulatory Visit | Attending: Family Medicine | Admitting: Family Medicine

## 2020-03-02 ENCOUNTER — Other Ambulatory Visit: Payer: Self-pay

## 2020-03-02 DIAGNOSIS — Z1231 Encounter for screening mammogram for malignant neoplasm of breast: Secondary | ICD-10-CM

## 2020-03-05 ENCOUNTER — Other Ambulatory Visit: Payer: Self-pay

## 2020-03-05 ENCOUNTER — Encounter: Payer: Self-pay | Admitting: Obstetrics and Gynecology

## 2020-03-05 ENCOUNTER — Ambulatory Visit (INDEPENDENT_AMBULATORY_CARE_PROVIDER_SITE_OTHER): Payer: Managed Care, Other (non HMO) | Admitting: Obstetrics and Gynecology

## 2020-03-05 VITALS — BP 118/74 | Ht 66.0 in | Wt 122.0 lb

## 2020-03-05 DIAGNOSIS — F419 Anxiety disorder, unspecified: Secondary | ICD-10-CM | POA: Diagnosis not present

## 2020-03-05 DIAGNOSIS — Z01419 Encounter for gynecological examination (general) (routine) without abnormal findings: Secondary | ICD-10-CM | POA: Diagnosis not present

## 2020-03-05 DIAGNOSIS — Z124 Encounter for screening for malignant neoplasm of cervix: Secondary | ICD-10-CM

## 2020-03-05 MED ORDER — ALPRAZOLAM 0.25 MG PO TABS: ORAL_TABLET | ORAL | 0 refills | Status: AC

## 2020-03-05 NOTE — Progress Notes (Signed)
   Gail Miller Feb 28, 1961 [id number]  SUBJECTIVE:  59 y.o. [id number] female here for annual routine gynecologic exam and Pap smear. She has no gynecologic concerns.  Current Outpatient Medications  Medication Sig Dispense Refill  . ALPRAZolam (XANAX) 0.25 MG tablet TAKE ONE TABLET BY MOUTH AT BEDTIME AS NEEDED FOR ANXIETY 30 tablet 0  . butalbital-acetaminophen-caffeine (FIORICET) 50-325-40 MG tablet TAKE 1 TABLET BY MOUTH EVERY 4 HOURS AS NEEDED FOR HEADACHE 30 tablet 0  . calcium carbonate (OS-CAL) 600 MG TABS Take 600 mg by mouth 2 (two) times daily with a meal.      . fish oil-omega-3 fatty acids 1000 MG capsule Take 2 g by mouth daily.      Marland Kitchen VITAMIN D PO Take by mouth.     No current facility-administered medications for this visit.   Allergies: Patient has no known allergies.  Patient's last menstrual period was 02/19/2012.  Past medical history,surgical history, problem list, medications, allergies, family history and social history were all reviewed and documented as reviewed in the EPIC chart.  ROS: Pertinent positives and negatives as reviewed in HPI    OBJECTIVE:  BP 118/74   Ht 5\' 6"  (1.676 m)   Wt 122 lb (55.3 kg)   LMP 02/19/2012   BMI 19.69 kg/m  The patient appears well, alert, oriented, in no distress. ENT normal.  Neck supple. No cervical or supraclavicular adenopathy or thyromegaly.  Lungs are clear, good air entry, no wheezes, rhonchi or rales. S1 and S2 normal, no murmurs, regular rate and rhythm.  Abdomen soft without tenderness, guarding, mass or organomegaly.  Neurological is normal, no focal findings.  BREAST EXAM: breasts appear normal, no suspicious masses, no skin or nipple changes or axillary nodes  PELVIC EXAM: VULVA: normal appearing vulva with no masses, tenderness or lesions, VAGINA: normal appearing vagina with normal color and discharge, no lesions, CERVIX: normal appearing cervix without discharge or lesions, UTERUS: uterus is normal size,  shape, consistency and nontender, ADNEXA: normal adnexa in size, nontender and no masses, PAP: Pap smear done today, thin-prep method  Chaperone: Caryn Bee present during the examination  ASSESSMENT:  59 y.o. [id number] here for annual gynecologic exam  PLAN:   1. Postmenopausal.  No significant hot flashes or night sweats.  No vaginal bleeding. 2. Pap smear 2018.  No significant history of abnormal Pap smears.   Pap smear collected today. 3. Mammogram 04/2019.  Normal breast exam today.  She is reminded to schedule an annual mammogram this year when due. 4. Episodic anxiety.  Rarely will take 0.25 mg alprazolam and requests a refill so I wrote her an rx for #30 no refills. 5. Colonoscopy 2017.  She will follow up at the recommended interval.  6. DEXA never, would plan at age 59. 39. Health maintenance.  No labs today as she says she had these completed with her primary care doctor this year.  Return annually or sooner, prn.  Joseph Pierini MD 03/05/20

## 2020-03-10 LAB — PAP IG W/ RFLX HPV ASCU

## 2020-03-10 LAB — HUMAN PAPILLOMAVIRUS, HIGH RISK: HPV DNA High Risk: NOT DETECTED

## 2020-04-02 ENCOUNTER — Ambulatory Visit: Payer: Managed Care, Other (non HMO) | Admitting: Plastic Surgery

## 2020-04-23 ENCOUNTER — Ambulatory Visit
Admission: RE | Admit: 2020-04-23 | Discharge: 2020-04-23 | Disposition: A | Discharge status: Home / Self Care | Payer: Managed Care, Other (non HMO) | Source: Ambulatory Visit | Attending: Family Medicine | Admitting: Family Medicine

## 2020-04-23 ENCOUNTER — Other Ambulatory Visit: Payer: Self-pay

## 2020-06-25 ENCOUNTER — Ambulatory Visit: Payer: Managed Care, Other (non HMO) | Admitting: Plastic Surgery

## 2021-05-26 ENCOUNTER — Ambulatory Visit: Payer: Managed Care, Other (non HMO) | Admitting: Obstetrics & Gynecology

## 2021-08-17 ENCOUNTER — Encounter: Payer: Self-pay | Admitting: Obstetrics & Gynecology

## 2021-08-17 ENCOUNTER — Ambulatory Visit (INDEPENDENT_AMBULATORY_CARE_PROVIDER_SITE_OTHER): Payer: BC Managed Care – PPO | Admitting: Obstetrics & Gynecology

## 2021-08-17 VITALS — BP 102/64 | HR 72 | Resp 18 | Ht 65.0 in | Wt 123.0 lb

## 2021-08-17 DIAGNOSIS — Z01419 Encounter for gynecological examination (general) (routine) without abnormal findings: Secondary | ICD-10-CM | POA: Diagnosis not present

## 2021-08-17 DIAGNOSIS — Z780 Asymptomatic menopausal state: Secondary | ICD-10-CM

## 2021-08-17 NOTE — Progress Notes (Signed)
? ? ?Gail Miller Jan 02, 1961 [id number] ? ? ?History:    61 y.o. [id number] ? ?RP:  Established patient presenting for annual gyn exam  ? ?HPI: Postmenopausal well on no HRT.  No PMB.  No pelvic pain.  Pap smear Neg 03/2020.  No significant history of abnormal Pap smears. Repeat Pap at 4 years.  Breasts normal.  Mammogram Neg 04/2020, repeat mammo asap. BMI 20.47. Health labs with Fam MD.  COLONOSCOPY: 05-28-12 ? ?Past medical history,surgical history, family history and social history were all reviewed and documented in the EPIC chart. ? ?Gynecologic History ?Patient's last menstrual period was 02/19/2012. ? ?Obstetric History ?OB History  ?Gravida Para Term Preterm AB Living  ?'4 3 3   1 3  '$ ?SAB IAB Ectopic Multiple Live Births  ?1       3  ?  ?# Outcome Date GA Lbr Len/2nd Weight Sex Delivery Anes PTL Lv  ?4 SAB           ?3 Term     M Vag-Spont  N LIV  ?2 Term     M Vag-Spont  N LIV  ?1 Term     M Vag-Spont  N LIV  ? ? ? ?ROS: A ROS was performed and pertinent positives and negatives are included in the history. ?GENERAL: No fevers or chills. HEENT: No change in vision, no earache, sore throat or sinus congestion. NECK: No pain or stiffness. CARDIOVASCULAR: No chest pain or pressure. No palpitations. PULMONARY: No shortness of breath, cough or wheeze. GASTROINTESTINAL: No abdominal pain, nausea, vomiting or diarrhea, melena or bright red blood per rectum. GENITOURINARY: No urinary frequency, urgency, hesitancy or dysuria. MUSCULOSKELETAL: No joint or muscle pain, no back pain, no recent trauma. DERMATOLOGIC: No rash, no itching, no lesions. ENDOCRINE: No polyuria, polydipsia, no heat or cold intolerance. No recent change in weight. HEMATOLOGICAL: No anemia or easy bruising or bleeding. NEUROLOGIC: No headache, seizures, numbness, tingling or weakness. PSYCHIATRIC: No depression, no loss of interest in normal activity or change in sleep pattern.  ?  ? ?Exam: ? ? ?BP 102/64   Pulse 72   Resp 18   Ht '5\' 5"'$  (1.651 m)    Wt 123 lb (55.8 kg)   LMP 02/19/2012 Comment: BTL  BMI 20.47 kg/m?  ? ?Body mass index is 20.47 kg/m?. ? ?General appearance : Well developed well nourished female. No acute distress ?HEENT: Eyes: no retinal hemorrhage or exudates,  Neck supple, trachea midline, no carotid bruits, no thyroidmegaly ?Lungs: Clear to auscultation, no rhonchi or wheezes, or rib retractions  ?Heart: Regular rate and rhythm, no murmurs or gallops ?Breast:Examined in sitting and supine position were symmetrical in appearance, no palpable masses or tenderness,  no skin retraction, no nipple inversion, no nipple discharge, no skin discoloration, no axillary or supraclavicular lymphadenopathy ?Abdomen: no palpable masses or tenderness, no rebound or guarding ?Extremities: no edema or skin discoloration or tenderness ? ?Pelvic: Vulva: Normal ?            Vagina: No gross lesions or discharge ? Cervix: No gross lesions or discharge ? Uterus  AV, normal size, shape and consistency, non-tender and mobile ? Adnexa  Without masses or tenderness ? Anus: Normal ? ? ?Assessment/Plan:  61 y.o. female for annual exam  ? ?1. Well female exam with routine gynecological exam ?Postmenopausal, well on no HRT.  No PMB.  No pelvic pain.  Pap smear Neg 03/2020.  No significant history of abnormal Pap smears. Repeat Pap at 3  years.  Breasts normal.  Mammogram Neg 04/2020, repeat mammo asap. BMI 20.47. Health labs with Fam MD.  COLONOSCOPY: 05-28-12 ? ?2. Postmenopause  ?Postmenopausal, well on no HRT.  No PMB.  No pelvic pain. ? ?Princess Bruins MD, 2:21 PM 08/17/2021 ? ?  ?

## 2021-08-20 ENCOUNTER — Ambulatory Visit: Payer: Self-pay | Admitting: Obstetrics & Gynecology

## 2022-09-25 IMAGING — MG DIGITAL SCREENING BREAST BILAT IMPLANT W/ TOMO W/ CAD
9 of 14 series · 9 of 34 positions shown · non-contrast
Comparison: Previous exam(s).

CLINICAL DATA: Screening.

EXAM:
DIGITAL SCREENING BILATERAL MAMMOGRAM WITH IMPLANTS, CAD AND TOMO
The patient has retropectoral implants. Standard and implant
displaced views were performed.

[R MLO]
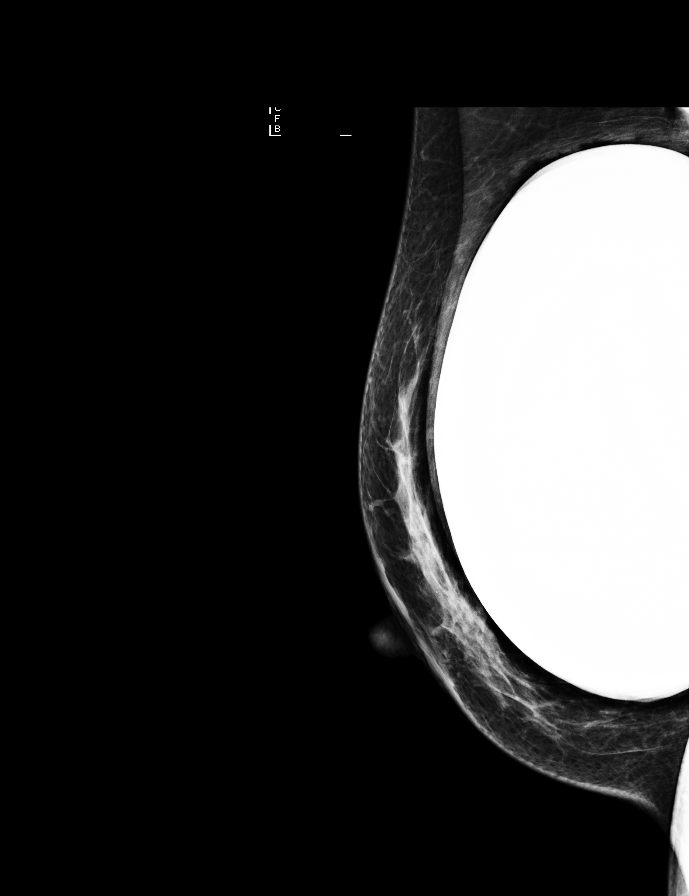

[L MLO]
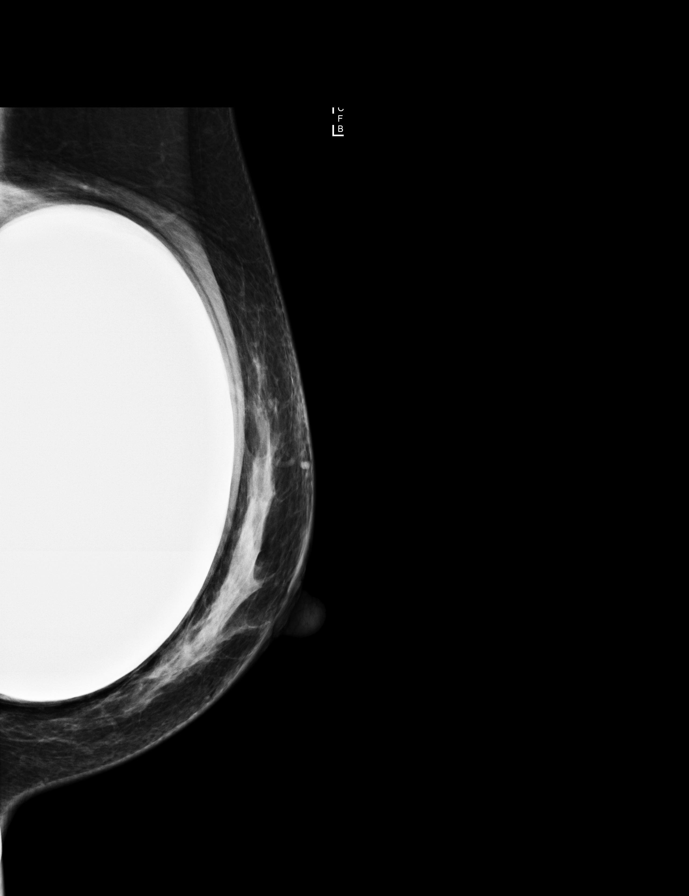

[R CC]
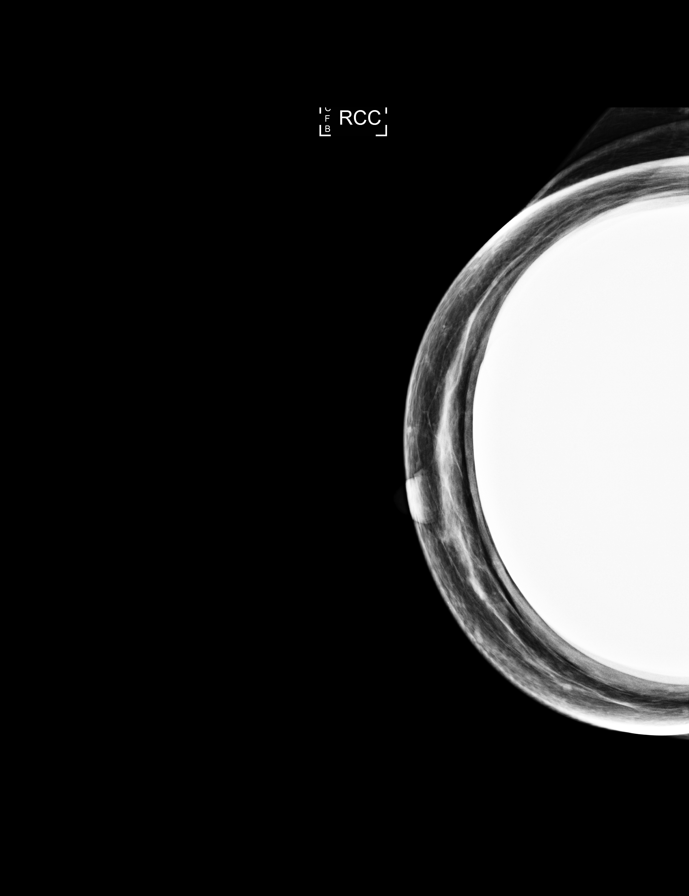

[L CC]
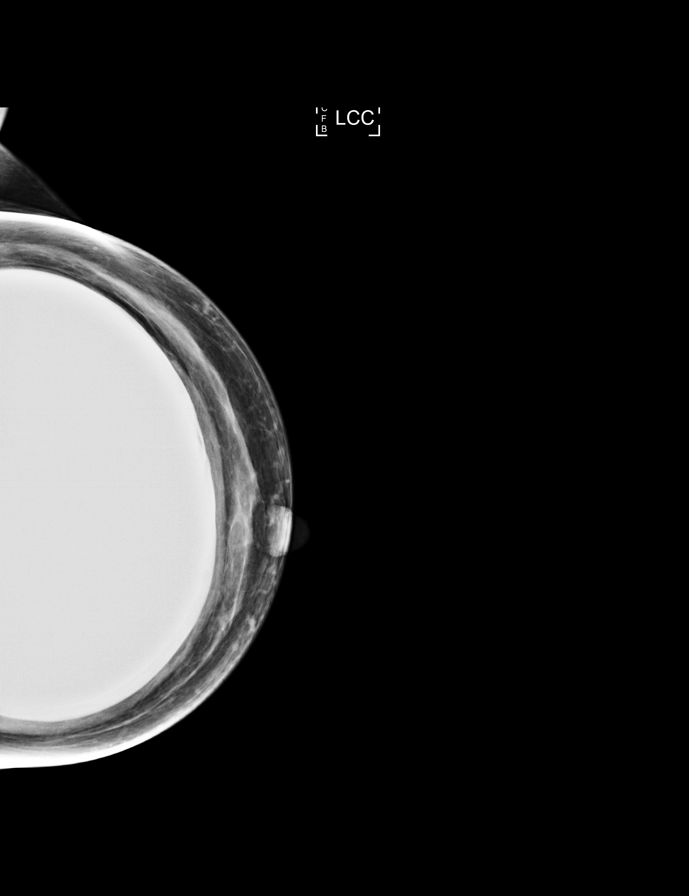

[R MLO synth-2D]
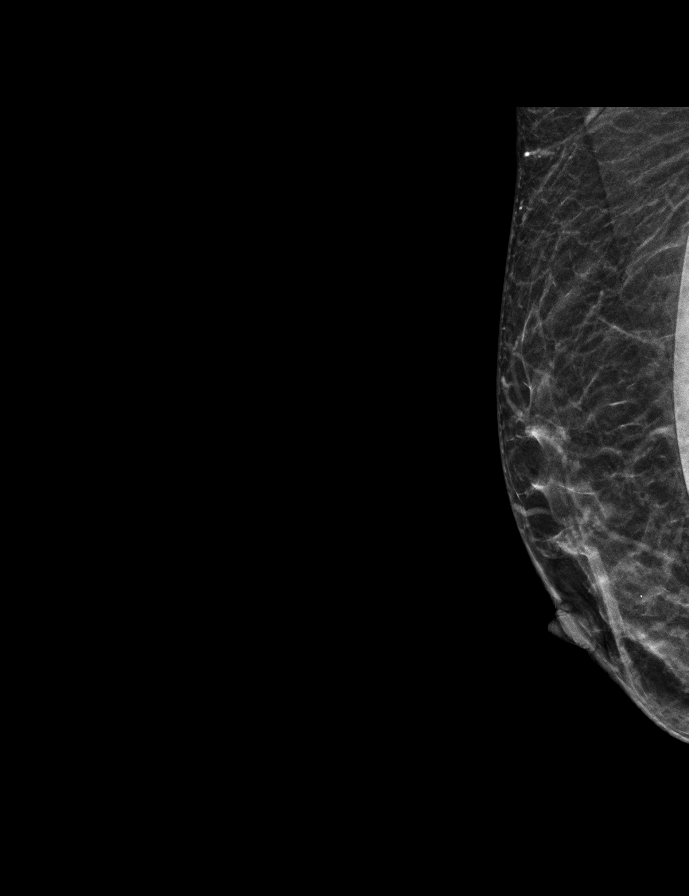

[R CC synth-2D]
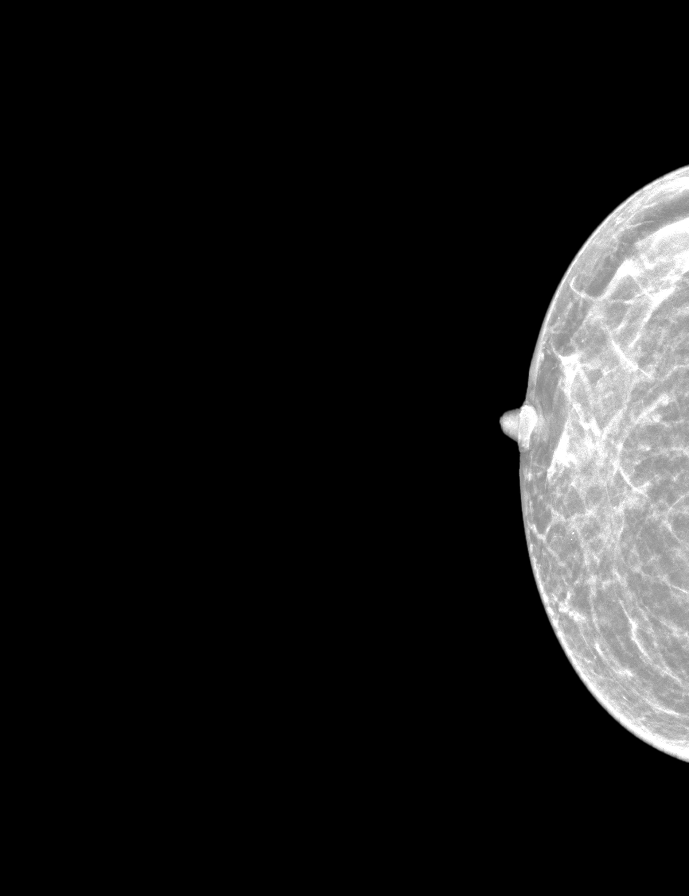

[L CC synth-2D (1 of 2)]
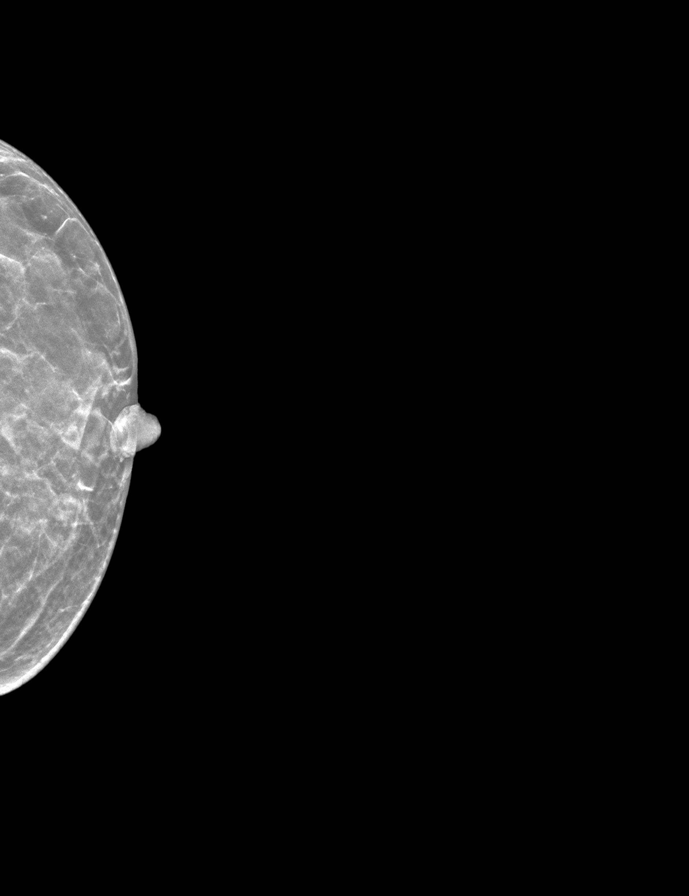

[L MLO synth-2D]
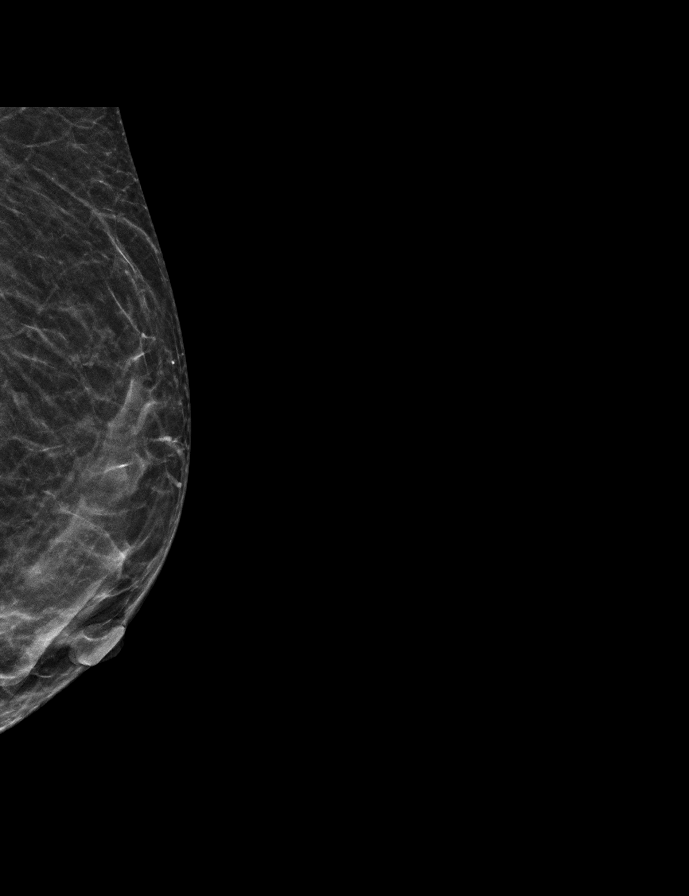

[L CC synth-2D (2 of 2)]
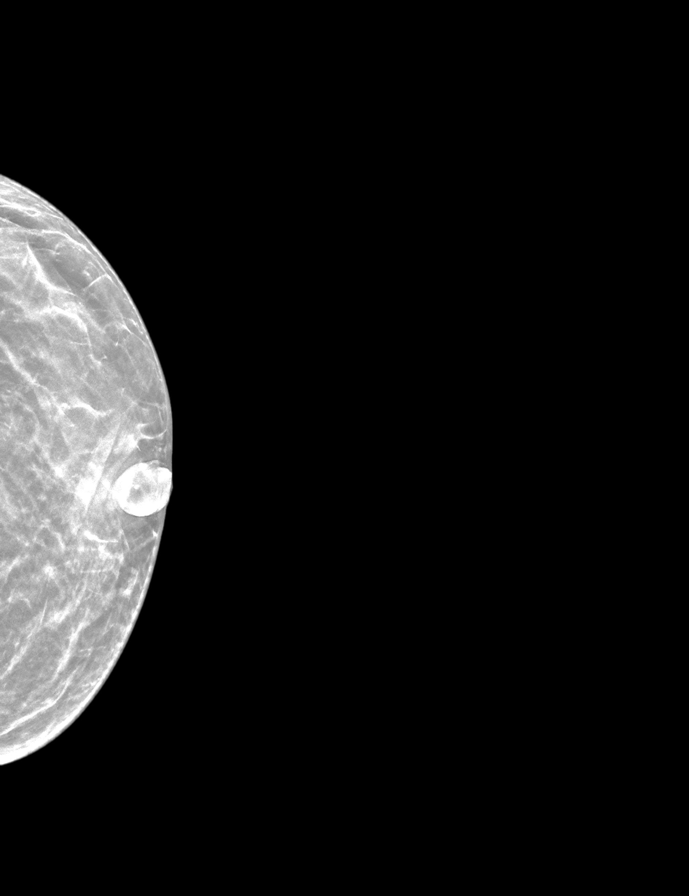

[9 of 34 positions shown; findings below may reference images not displayed]

ACR Breast Density Category c: The breast tissue is heterogeneously
dense, which may obscure small masses.
FINDINGS: There are no findings suspicious for malignancy. Images were
processed with CAD.
IMPRESSION: No mammographic evidence of malignancy. A result letter of this
screening mammogram will be mailed directly to the patient.

RECOMMENDATION:
Screening mammogram in one year. (Code:49-X-OQ9)

BI-RADS CATEGORY  1:  Negative.
